# Patient Record
Sex: Female | Born: 1950 | Race: White | Hispanic: No | Marital: Married | Smoking: Former smoker
Health system: Southern US, Community
[De-identification: ages and names within clinical notes are randomized; demographics above are authoritative.]

## PROBLEM LIST (undated history)

## (undated) DIAGNOSIS — Z889 Allergy status to unspecified drugs, medicaments and biological substances status: Secondary | ICD-10-CM

## (undated) DIAGNOSIS — K219 Gastro-esophageal reflux disease without esophagitis: Secondary | ICD-10-CM

## (undated) DIAGNOSIS — F419 Anxiety disorder, unspecified: Secondary | ICD-10-CM

## (undated) DIAGNOSIS — F329 Major depressive disorder, single episode, unspecified: Secondary | ICD-10-CM

## (undated) DIAGNOSIS — E785 Hyperlipidemia, unspecified: Secondary | ICD-10-CM

## (undated) DIAGNOSIS — F32A Depression, unspecified: Secondary | ICD-10-CM

## (undated) DIAGNOSIS — I10 Essential (primary) hypertension: Secondary | ICD-10-CM

## (undated) DIAGNOSIS — H269 Unspecified cataract: Secondary | ICD-10-CM

## (undated) HISTORY — PX: TUBAL LIGATION: SHX77

## (undated) HISTORY — DX: Unspecified cataract: H26.9

## (undated) HISTORY — DX: Gastro-esophageal reflux disease without esophagitis: K21.9

---

## 2004-04-25 ENCOUNTER — Other Ambulatory Visit: Payer: Self-pay

## 2006-05-30 ENCOUNTER — Ambulatory Visit: Payer: Self-pay

## 2007-01-23 ENCOUNTER — Ambulatory Visit: Payer: Self-pay | Admitting: Family Medicine

## 2008-01-22 ENCOUNTER — Ambulatory Visit: Payer: Self-pay

## 2009-05-04 ENCOUNTER — Ambulatory Visit: Payer: Self-pay

## 2010-05-04 ENCOUNTER — Ambulatory Visit: Payer: Self-pay

## 2010-06-08 ENCOUNTER — Ambulatory Visit: Payer: Self-pay

## 2011-05-17 ENCOUNTER — Ambulatory Visit: Payer: Self-pay

## 2012-06-25 ENCOUNTER — Ambulatory Visit: Payer: Self-pay

## 2012-07-01 ENCOUNTER — Ambulatory Visit: Payer: Self-pay

## 2013-12-28 ENCOUNTER — Emergency Department: Payer: Self-pay | Admitting: Emergency Medicine

## 2014-06-03 LAB — TSH: TSH: 1.65 u[IU]/mL (ref 0.41–5.90)

## 2014-07-01 ENCOUNTER — Ambulatory Visit: Payer: Self-pay

## 2014-07-14 ENCOUNTER — Ambulatory Visit: Payer: Self-pay

## 2014-08-12 DIAGNOSIS — F329 Major depressive disorder, single episode, unspecified: Secondary | ICD-10-CM | POA: Diagnosis not present

## 2014-11-25 LAB — HEPATIC FUNCTION PANEL
ALT: 14 U/L (ref 7–35)
AST: 15 U/L (ref 13–35)
Alkaline Phosphatase: 107 U/L (ref 25–125)
BILIRUBIN, TOTAL: 0.3 mg/dL

## 2014-11-25 LAB — BASIC METABOLIC PANEL
BUN: 16 mg/dL (ref 4–21)
Creatinine: 0.7 mg/dL (ref 0.5–1.1)
GLUCOSE: 91 mg/dL
POTASSIUM: 4.6 mmol/L (ref 3.4–5.3)
SODIUM: 140 mmol/L (ref 137–147)

## 2014-11-25 LAB — HEMOGLOBIN A1C: Hemoglobin A1C: 6.2

## 2014-11-25 LAB — LIPID PANEL
Cholesterol: 197 mg/dL (ref 0–200)
HDL: 63 mg/dL (ref 35–70)
LDL CALC: 115 mg/dL
TRIGLYCERIDES: 94 mg/dL (ref 40–160)

## 2014-11-25 LAB — CBC AND DIFFERENTIAL
HEMATOCRIT: 42 % (ref 36–46)
Hemoglobin: 13.3 g/dL (ref 12.0–16.0)
NEUTROS ABS: 4 /uL
Platelets: 243 10*3/uL (ref 150–399)
WBC: 6.7 10^3/mL

## 2014-12-09 DIAGNOSIS — E785 Hyperlipidemia, unspecified: Secondary | ICD-10-CM | POA: Insufficient documentation

## 2014-12-09 DIAGNOSIS — I10 Essential (primary) hypertension: Secondary | ICD-10-CM | POA: Insufficient documentation

## 2014-12-18 ENCOUNTER — Other Ambulatory Visit: Payer: Self-pay | Admitting: Nurse Practitioner

## 2014-12-18 ENCOUNTER — Other Ambulatory Visit: Payer: Self-pay | Admitting: Oncology

## 2014-12-18 DIAGNOSIS — N6310 Unspecified lump in the right breast, unspecified quadrant: Secondary | ICD-10-CM

## 2014-12-18 DIAGNOSIS — R921 Mammographic calcification found on diagnostic imaging of breast: Secondary | ICD-10-CM

## 2015-01-13 ENCOUNTER — Ambulatory Visit
Admission: RE | Admit: 2015-01-13 | Discharge: 2015-01-13 | Disposition: A | Discharge status: Home / Self Care | Payer: Medicaid Other | Source: Ambulatory Visit | Attending: Oncology | Admitting: Oncology

## 2015-01-13 ENCOUNTER — Ambulatory Visit: Payer: Self-pay

## 2015-01-13 DIAGNOSIS — R921 Mammographic calcification found on diagnostic imaging of breast: Secondary | ICD-10-CM

## 2015-06-09 ENCOUNTER — Other Ambulatory Visit: Payer: Self-pay

## 2015-06-16 ENCOUNTER — Ambulatory Visit: Payer: Self-pay | Admitting: Internal Medicine

## 2015-06-22 ENCOUNTER — Other Ambulatory Visit: Payer: Self-pay | Admitting: Oncology

## 2015-06-22 DIAGNOSIS — R921 Mammographic calcification found on diagnostic imaging of breast: Secondary | ICD-10-CM

## 2015-06-23 ENCOUNTER — Other Ambulatory Visit: Payer: Self-pay | Admitting: Oncology

## 2015-06-23 ENCOUNTER — Other Ambulatory Visit: Payer: Self-pay | Admitting: Internal Medicine

## 2015-06-23 DIAGNOSIS — R921 Mammographic calcification found on diagnostic imaging of breast: Secondary | ICD-10-CM

## 2015-06-30 ENCOUNTER — Other Ambulatory Visit: Payer: Self-pay

## 2015-07-09 ENCOUNTER — Ambulatory Visit
Admission: RE | Admit: 2015-07-09 | Discharge: 2015-07-09 | Disposition: A | Discharge status: Home / Self Care | Payer: Medicare HMO | Source: Ambulatory Visit | Attending: Internal Medicine | Admitting: Internal Medicine

## 2015-07-09 DIAGNOSIS — R921 Mammographic calcification found on diagnostic imaging of breast: Secondary | ICD-10-CM | POA: Insufficient documentation

## 2015-07-12 ENCOUNTER — Other Ambulatory Visit: Payer: Self-pay | Admitting: Internal Medicine

## 2015-07-12 DIAGNOSIS — R921 Mammographic calcification found on diagnostic imaging of breast: Secondary | ICD-10-CM

## 2015-07-13 ENCOUNTER — Ambulatory Visit
Admission: RE | Admit: 2015-07-13 | Discharge: 2015-07-13 | Disposition: A | Discharge status: Home / Self Care | Payer: Medicare HMO | Source: Ambulatory Visit | Attending: Internal Medicine | Admitting: Internal Medicine

## 2015-07-13 DIAGNOSIS — R921 Mammographic calcification found on diagnostic imaging of breast: Secondary | ICD-10-CM

## 2015-07-13 HISTORY — PX: BREAST BIOPSY: SHX20

## 2015-07-14 LAB — SURGICAL PATHOLOGY

## 2015-09-09 NOTE — Addendum Note (Signed)
Encounter addended by: Wayne Both on: 09/09/2015  1:55 PM<BR>     Documentation filed: Charges VN

## 2015-09-17 ENCOUNTER — Encounter: Payer: Self-pay | Admitting: *Deleted

## 2015-09-20 ENCOUNTER — Encounter: Payer: Self-pay | Admitting: *Deleted

## 2015-09-20 ENCOUNTER — Encounter
Admission: RE | Disposition: A | Discharge status: Home / Self Care | Payer: Self-pay | Source: Ambulatory Visit | Attending: Gastroenterology

## 2015-09-20 ENCOUNTER — Encounter: Payer: Self-pay | Admitting: Certified Registered"

## 2015-09-20 ENCOUNTER — Ambulatory Visit
Admission: RE | Admit: 2015-09-20 | Discharge: 2015-09-20 | Disposition: A | Discharge status: Home / Self Care | Payer: Medicare HMO | Source: Ambulatory Visit | Attending: Gastroenterology | Admitting: Gastroenterology

## 2015-09-20 DIAGNOSIS — K5730 Diverticulosis of large intestine without perforation or abscess without bleeding: Secondary | ICD-10-CM | POA: Diagnosis not present

## 2015-09-20 DIAGNOSIS — K635 Polyp of colon: Secondary | ICD-10-CM | POA: Diagnosis not present

## 2015-09-20 DIAGNOSIS — J302 Other seasonal allergic rhinitis: Secondary | ICD-10-CM | POA: Insufficient documentation

## 2015-09-20 DIAGNOSIS — E785 Hyperlipidemia, unspecified: Secondary | ICD-10-CM | POA: Diagnosis not present

## 2015-09-20 DIAGNOSIS — I10 Essential (primary) hypertension: Secondary | ICD-10-CM | POA: Insufficient documentation

## 2015-09-20 DIAGNOSIS — F419 Anxiety disorder, unspecified: Secondary | ICD-10-CM | POA: Insufficient documentation

## 2015-09-20 DIAGNOSIS — D123 Benign neoplasm of transverse colon: Secondary | ICD-10-CM | POA: Insufficient documentation

## 2015-09-20 DIAGNOSIS — Z79899 Other long term (current) drug therapy: Secondary | ICD-10-CM | POA: Diagnosis not present

## 2015-09-20 DIAGNOSIS — Z1211 Encounter for screening for malignant neoplasm of colon: Secondary | ICD-10-CM | POA: Diagnosis present

## 2015-09-20 DIAGNOSIS — D122 Benign neoplasm of ascending colon: Secondary | ICD-10-CM | POA: Diagnosis not present

## 2015-09-20 DIAGNOSIS — K621 Rectal polyp: Secondary | ICD-10-CM | POA: Insufficient documentation

## 2015-09-20 HISTORY — PX: COLONOSCOPY WITH PROPOFOL: SHX5780

## 2015-09-20 HISTORY — DX: Allergy status to unspecified drugs, medicaments and biological substances: Z88.9

## 2015-09-20 HISTORY — DX: Anxiety disorder, unspecified: F41.9

## 2015-09-20 HISTORY — DX: Essential (primary) hypertension: I10

## 2015-09-20 HISTORY — DX: Hyperlipidemia, unspecified: E78.5

## 2015-09-20 SURGERY — COLONOSCOPY WITH PROPOFOL
Anesthesia: General

## 2015-09-20 MED ORDER — MIDAZOLAM HCL 5 MG/5ML IJ SOLN
INTRAMUSCULAR | Status: DC | PRN
  Administered 2015-09-20: 1 mg via INTRAVENOUS

## 2015-09-20 MED ORDER — MIDAZOLAM HCL 5 MG/5ML IJ SOLN
INTRAMUSCULAR | Status: AC
  Filled 2015-09-20: qty 5

## 2015-09-20 MED ORDER — FENTANYL CITRATE (PF) 100 MCG/2ML IJ SOLN
INTRAMUSCULAR | Status: DC | PRN
  Administered 2015-09-20: 25 ug via INTRAVENOUS

## 2015-09-20 MED ORDER — SODIUM CHLORIDE 0.9 % IV SOLN
INTRAVENOUS | Status: DC
  Administered 2015-09-20: 15:00:00 via INTRAVENOUS

## 2015-09-20 MED ORDER — PROMETHAZINE HCL 25 MG/ML IJ SOLN
INTRAMUSCULAR | Status: AC
  Filled 2015-09-20: qty 1

## 2015-09-20 MED ORDER — SODIUM CHLORIDE 0.9 % IV SOLN: INTRAVENOUS | Status: DC

## 2015-09-20 MED ORDER — FENTANYL CITRATE (PF) 100 MCG/2ML IJ SOLN
INTRAMUSCULAR | Status: DC | PRN
  Administered 2015-09-20: 50 ug via INTRAVENOUS

## 2015-09-20 MED ORDER — FENTANYL CITRATE (PF) 100 MCG/2ML IJ SOLN
INTRAMUSCULAR | Status: AC
  Filled 2015-09-20: qty 2

## 2015-09-20 MED ORDER — MIDAZOLAM HCL 5 MG/5ML IJ SOLN
INTRAMUSCULAR | Status: DC | PRN
  Administered 2015-09-20: 2 mg via INTRAVENOUS

## 2015-09-20 MED ORDER — PROMETHAZINE HCL 25 MG/ML IJ SOLN
INTRAMUSCULAR | Status: DC | PRN
  Administered 2015-09-20: 12.5 mg via INTRAVENOUS

## 2015-09-20 NOTE — Op Note (Signed)
[facility]enter[facility] Patient Name: Clarise Thielen Procedure Date: 09/20/2015 3:57 PM MRN: [id number] Account #: [id number] Date of Birth: [birth year] Admit Type: Outpatient Age: 65 Room: [facility] ENDO ROOM 3 Gender: Female Note Status: Finalized Procedure:         Colonoscopy Indications:       Screening for colorectal malignant neoplasm, This is the                     patient's first colonoscopy Providers:         Lollie Sails, MD Referring MD:      Perrin Maltese, MD (Referring MD) Medicines:         Fentanyl 125 micrograms IV, Midazolam 6 mg IV,                     Promethazine [id number] mg IV Complications:     No immediate complications. Procedure:         Pre-Anesthesia Assessment:                    - ASA Grade Assessment: II - A patient with mild systemic                     disease.                    After obtaining informed consent, the colonoscope was                     passed under direct vision. Throughout the procedure, the                     patient's blood pressure, pulse, and oxygen saturations                     were monitored continuously. The Colonoscope was                     introduced through the anus and advanced to the the cecum,                     identified by appendiceal orifice and ileocecal valve. The                     quality of the bowel preparation was good except the                     ascending colon was fair. Findings:      Multiple small-mouthed diverticula were found in the sigmoid colon and       in the descending colon.      A 2 mm polyp was found in the transverse colon. The polyp was sessile.       The polyp was removed with a cold biopsy forceps. Resection and       retrieval were complete.      A 3 mm polyp was found in the ascending colon. The polyp was sessile.       The polyp was removed with a cold biopsy forceps. Resection and       retrieval were complete.      A 5 mm polyp was found in the distal  transverse colon. The polyp was       flat. The polyp was removed with a cold biopsy forceps. The polyp was  removed with a cold snare. Resection and retrieval were complete.      A 2 mm polyp was found in the recto-sigmoid colon. The polyp was       sessile. The polyp was removed with a cold biopsy forceps. Resection and       retrieval were complete.      A 5 mm polyp was found in the rectum. The polyp was sessile. The polyp       was removed with a cold snare. Resection and retrieval were complete. Impression:        - Diverticulosis in the sigmoid colon and in the                     descending colon.                    - One 2 mm polyp in the transverse colon. Resected and                     retrieved.                    - One 3 mm polyp in the ascending colon. Resected and                     retrieved.                    - One 5 mm polyp in the distal transverse colon. Resected                     and retrieved.                    - One 2 mm polyp at the recto-sigmoid colon. Resected and                     retrieved.                    - One 5 mm polyp in the rectum. Resected and retrieved. Recommendation:    - Await pathology results.                    - Full liquid diet today.                    - Soft diet for 2 days. Procedure Code(s): --- Professional ---                    [phone], Colonoscopy, flexible; with removal of tumor(s),                     polyp(s), or other lesion(s) by snare technique                    [id number], 46, Colonoscopy, flexible; with biopsy, single or                     multiple Diagnosis Code(s): --- Professional ---                    Z12.11, Encounter for screening for malignant neoplasm of                     colon  D12.2, Benign neoplasm of ascending colon                    D12.3, Benign neoplasm of transverse colon                    D12.7, Benign neoplasm of rectosigmoid junction                    K62.1, Rectal  polyp                    K57.30, Diverticulosis of large intestine without                     perforation or abscess without bleeding CPT copyright 2014 American Medical Association. All rights reserved. The codes documented in this report are preliminary and upon coder review may  be revised to meet current compliance requirements. Lollie Sails, MD 09/20/2015 4:59:11 PM This report has been signed electronically. Number of Addenda: 0 Note Initiated On: 09/20/2015 3:57 PM Scope Withdrawal Time: 0 hours 20 minutes 6 seconds  Total Procedure Duration: 0 hours 36 minutes 46 seconds       [facility]

## 2015-09-20 NOTE — H&P (Signed)
Outpatient short stay form Pre-procedure 09/20/2015 3:54 PM Lollie Sails MD  Primary Physician: Dr Lamonte Sakai  Reason for visit:  Screening colonoscopy  History of present illness:  Patient is a 65 year old female presenting today for a colonoscopy. She's never had a colonoscopy in the past. She tolerated her prep well. She takes no anticoagulation medications or aspirin products.    Current facility-administered medications:  .  0.9 %  sodium chloride infusion, , Intravenous, Continuous, Lollie Sails, MD, Last Rate: 20 mL/hr at 09/20/15 1441 .  0.9 %  sodium chloride infusion, , Intravenous, Continuous, Lollie Sails, MD .  fentaNYL (SUBLIMAZE) 100 MCG/2ML injection, , , ,  .  midazolam (VERSED) 5 MG/5ML injection, , , ,  .  promethazine (PHENERGAN) 25 MG/ML injection, , , ,   Prescriptions prior to admission  Medication Sig Dispense Refill Last Dose  . FLUoxetine (PROZAC) 10 MG capsule Take 10 mg by mouth daily.   09/19/2015 at Unknown time  . lisinopril-hydrochlorothiazide (PRINZIDE,ZESTORETIC) 10-12.5 MG tablet Take 1 tablet by mouth daily.   09/19/2015 at Unknown time  . meloxicam (MOBIC) 15 MG tablet Take 15 mg by mouth daily.   Past Month at Unknown time  . pravastatin (PRAVACHOL) 20 MG tablet Take 20 mg by mouth daily.   09/19/2015 at Unknown time  . bacitracin-neomycin-polymyxin-hydrocortisone (CORTISPORIN) 1 % ointment Apply 1 application topically daily. Reported on 09/20/2015   Completed Course at Unknown time  . loratadine (CLARITIN) 10 MG tablet Take 10 mg by mouth daily. Reported on 09/20/2015   Not Taking at Unknown time  . neomycin-polymyxin-gramicidin (NEOSPORIN) 1.75-10000-.025 ophthalmic solution Place 2 drops into the left eye 4 (four) times daily. Reported on 09/20/2015   Completed Course at Unknown time     No Known Allergies   Past Medical History  Diagnosis Date  . Hyperlipidemia   . Hypertension   . Anxiety   . H/O seasonal allergies      Review of systems:      Physical Exam    Heart and lungs: Regular rate and rhythm without rub or gallop, lungs are bilaterally clear    HEENT: Normocephalic atraumatic eyes are anicteric    Other:     Pertinant exam for procedure: Soft nontender nondistended bowel sounds positive normoactive    Planned proceedures: Colonoscopy and indicated procedures. I have discussed the risks benefits and complications of procedures to include not limited to bleeding, infection, perforation and the risk of sedation and the patient wishes to proceed.    Lollie Sails, MD Gastroenterology 09/20/2015  3:54 PM

## 2015-09-22 ENCOUNTER — Encounter: Payer: Self-pay | Admitting: Gastroenterology

## 2015-09-22 LAB — SURGICAL PATHOLOGY

## 2016-04-12 DIAGNOSIS — I10 Essential (primary) hypertension: Secondary | ICD-10-CM

## 2016-04-12 DIAGNOSIS — E785 Hyperlipidemia, unspecified: Secondary | ICD-10-CM

## 2016-07-25 ENCOUNTER — Other Ambulatory Visit: Payer: Self-pay | Admitting: Internal Medicine

## 2016-07-25 DIAGNOSIS — Z1231 Encounter for screening mammogram for malignant neoplasm of breast: Secondary | ICD-10-CM

## 2016-07-31 ENCOUNTER — Ambulatory Visit
Admission: RE | Admit: 2016-07-31 | Discharge: 2016-07-31 | Disposition: A | Discharge status: Home / Self Care | Payer: Medicare Other | Source: Ambulatory Visit | Attending: Internal Medicine | Admitting: Internal Medicine

## 2016-07-31 DIAGNOSIS — Z1231 Encounter for screening mammogram for malignant neoplasm of breast: Secondary | ICD-10-CM

## 2017-07-20 ENCOUNTER — Other Ambulatory Visit: Payer: Self-pay | Admitting: Internal Medicine

## 2017-07-20 DIAGNOSIS — Z1231 Encounter for screening mammogram for malignant neoplasm of breast: Secondary | ICD-10-CM

## 2017-08-10 ENCOUNTER — Ambulatory Visit
Admission: RE | Admit: 2017-08-10 | Discharge: 2017-08-10 | Disposition: A | Discharge status: Home / Self Care | Payer: Medicare Other | Source: Ambulatory Visit | Attending: Internal Medicine | Admitting: Internal Medicine

## 2017-08-10 DIAGNOSIS — Z1231 Encounter for screening mammogram for malignant neoplasm of breast: Secondary | ICD-10-CM

## 2018-03-11 ENCOUNTER — Ambulatory Visit: Payer: Self-pay | Admitting: General Surgery

## 2018-03-22 ENCOUNTER — Other Ambulatory Visit: Payer: Self-pay

## 2018-03-22 ENCOUNTER — Encounter
Admission: RE | Admit: 2018-03-22 | Discharge: 2018-03-22 | Disposition: A | Discharge status: Home / Self Care | Payer: Medicare Other | Source: Ambulatory Visit | Attending: General Surgery | Admitting: General Surgery

## 2018-03-22 DIAGNOSIS — Z01810 Encounter for preprocedural cardiovascular examination: Secondary | ICD-10-CM | POA: Insufficient documentation

## 2018-03-22 DIAGNOSIS — Z01812 Encounter for preprocedural laboratory examination: Secondary | ICD-10-CM | POA: Diagnosis not present

## 2018-03-22 DIAGNOSIS — I10 Essential (primary) hypertension: Secondary | ICD-10-CM | POA: Diagnosis not present

## 2018-03-22 HISTORY — DX: Major depressive disorder, single episode, unspecified: F32.9

## 2018-03-22 HISTORY — DX: Depression, unspecified: F32.A

## 2018-03-22 NOTE — Patient Instructions (Signed)
Your procedure is scheduled on: March 29, 2018 FRIDAY Report to Day Surgery on the 2nd floor of the [facility]. To find out your arrival time, please call [phone] between 1PM - 3PM on: Thursday March 28, 2018   REMEMBER: Instructions that are not followed completely may result in serious medical risk, up to and including death; or upon the discretion of your surgeon and anesthesiologist your surgery may need to be rescheduled.  Do not eat food after midnight the night before your procedure.  No gum chewing, lozengers or hard candies.  You may however, drink CLEAR liquids up to 2 hours before you are scheduled to arrive for your surgery. Do not drink anything within 2 hours of the start of your surgery.  Clear liquids include: - water  - apple juice without pulp - clear gatorade - black coffee or tea (Do NOT add anything to the coffee or tea) Do NOT drink anything that is not on this list.  Type 1 and Type 2 diabetics should only drink water.  No Alcohol for 24 hours before or after surgery.  No Smoking including e-cigarettes for 24 hours prior to surgery.  No chewable tobacco products for at least 6 hours prior to surgery.  No nicotine patches on the day of surgery.  On the morning of surgery brush your teeth with toothpaste and water, you may rinse your mouth with mouthwash if you wish. Do not swallow any toothpaste or mouthwash.  Notify your doctor if there is any change in your medical condition (cold, fever, infection).  Do not wear jewelry, make-up, hairpins, clips or nail polish.  Do not wear lotions, powders, or perfumes. You may NOT wear deodorant.  Do not shave 48 hours prior to surgery. Men may shave face and neck.  Contacts and dentures may not be worn into surgery.  Do not bring valuables to the hospital, including drivers license, insurance or credit cards.  [place] is not responsible for any belongings or valuables.   TAKE THESE MEDICATIONS THE  MORNING OF SURGERY:  Use CHG Soap as directed on instruction sheet.   Stop Anti-inflammatories (NSAIDS) such as Advil, Aleve, Ibuprofen, Motrin, Naproxen, Naprosyn and Aspirin based products such as Excedrin, Goodys Powder, BC Powder. (May take Tylenol or Acetaminophen if needed.)  Stop ANY OVER THE COUNTER supplements until after surgery. (May continue Vitamin D, Vitamin B, and multivitamin.)  Wear comfortable clothing (specific to your surgery type) to the hospital.  Plan for stool softeners for home use.  If you are being admitted to the hospital overnight, leave your suitcase in the car. After surgery it may be brought to your room.  If you are being discharged the day of surgery, you will not be allowed to drive home. You will need a responsible adult to drive you home and stay with you that night.   If you are taking public transportation, you will need to have a responsible adult with you. Please confirm with your physician that it is acceptable to use public transportation.   Please call [phone] if you have any questions about these instructions.

## 2018-03-29 ENCOUNTER — Encounter
Admission: RE | Disposition: A | Discharge status: Home / Self Care | Payer: Self-pay | Source: Ambulatory Visit | Attending: General Surgery

## 2018-03-29 ENCOUNTER — Ambulatory Visit: Payer: Medicare Other | Admitting: Certified Registered Nurse Anesthetist

## 2018-03-29 ENCOUNTER — Other Ambulatory Visit: Payer: Self-pay

## 2018-03-29 ENCOUNTER — Ambulatory Visit
Admission: RE | Admit: 2018-03-29 | Discharge: 2018-03-29 | Disposition: A | Discharge status: Home / Self Care | Payer: Medicare Other | Source: Ambulatory Visit | Attending: General Surgery | Admitting: General Surgery

## 2018-03-29 DIAGNOSIS — I10 Essential (primary) hypertension: Secondary | ICD-10-CM | POA: Insufficient documentation

## 2018-03-29 DIAGNOSIS — Z79899 Other long term (current) drug therapy: Secondary | ICD-10-CM | POA: Insufficient documentation

## 2018-03-29 DIAGNOSIS — E785 Hyperlipidemia, unspecified: Secondary | ICD-10-CM | POA: Insufficient documentation

## 2018-03-29 DIAGNOSIS — Z87891 Personal history of nicotine dependence: Secondary | ICD-10-CM | POA: Insufficient documentation

## 2018-03-29 DIAGNOSIS — D1721 Benign lipomatous neoplasm of skin and subcutaneous tissue of right arm: Secondary | ICD-10-CM | POA: Diagnosis present

## 2018-03-29 DIAGNOSIS — E7800 Pure hypercholesterolemia, unspecified: Secondary | ICD-10-CM | POA: Diagnosis not present

## 2018-03-29 DIAGNOSIS — F329 Major depressive disorder, single episode, unspecified: Secondary | ICD-10-CM | POA: Diagnosis not present

## 2018-03-29 DIAGNOSIS — F419 Anxiety disorder, unspecified: Secondary | ICD-10-CM | POA: Insufficient documentation

## 2018-03-29 HISTORY — PX: LIPOMA EXCISION: SHX5283

## 2018-03-29 SURGERY — EXCISION LIPOMA
Anesthesia: General | Site: Arm Lower | Laterality: Right | Wound class: Clean

## 2018-03-29 MED ORDER — CHLORHEXIDINE GLUCONATE CLOTH 2 % EX PADS: 6.0000 | MEDICATED_PAD | Freq: Once | CUTANEOUS | Status: DC

## 2018-03-29 MED ORDER — MIDAZOLAM HCL 5 MG/5ML IJ SOLN
INTRAMUSCULAR | Status: AC
  Filled 2018-03-29: qty 5

## 2018-03-29 MED ORDER — LACTATED RINGERS IV SOLN
INTRAVENOUS | Status: DC
  Administered 2018-03-29: 11:00:00 via INTRAVENOUS

## 2018-03-29 MED ORDER — FAMOTIDINE 20 MG PO TABS
ORAL_TABLET | ORAL | Status: AC
  Administered 2018-03-29: 20 mg via ORAL
  Filled 2018-03-29: qty 1

## 2018-03-29 MED ORDER — PROPOFOL 500 MG/50ML IV EMUL
INTRAVENOUS | Status: AC
Start: 2018-03-29 — End: ?
  Filled 2018-03-29: qty 50

## 2018-03-29 MED ORDER — PROMETHAZINE HCL 25 MG/ML IJ SOLN: 6.2500 mg | INTRAMUSCULAR | Status: DC | PRN

## 2018-03-29 MED ORDER — BUPIVACAINE HCL (PF) 0.25 % IJ SOLN
INTRAMUSCULAR | Status: AC
Start: 2018-03-29 — End: ?
  Filled 2018-03-29: qty 30

## 2018-03-29 MED ORDER — FENTANYL CITRATE (PF) 100 MCG/2ML IJ SOLN
INTRAMUSCULAR | Status: AC
  Filled 2018-03-29: qty 2

## 2018-03-29 MED ORDER — PROPOFOL 10 MG/ML IV BOLUS
INTRAVENOUS | Status: AC
  Filled 2018-03-29: qty 20

## 2018-03-29 MED ORDER — OXYCODONE HCL 5 MG PO TABS: 5.0000 mg | ORAL_TABLET | Freq: Once | ORAL | Status: DC | PRN

## 2018-03-29 MED ORDER — OXYCODONE HCL 5 MG/5ML PO SOLN: 5.0000 mg | Freq: Once | ORAL | Status: DC | PRN

## 2018-03-29 MED ORDER — FENTANYL CITRATE (PF) 100 MCG/2ML IJ SOLN: 25.0000 ug | INTRAMUSCULAR | Status: DC | PRN

## 2018-03-29 MED ORDER — LIDOCAINE-EPINEPHRINE (PF) 1 %-1:200000 IJ SOLN
INTRAMUSCULAR | Status: DC | PRN
  Administered 2018-03-29: 6 mL

## 2018-03-29 MED ORDER — LIDOCAINE-EPINEPHRINE (PF) 1 %-1:200000 IJ SOLN
INTRAMUSCULAR | Status: AC
  Filled 2018-03-29: qty 30

## 2018-03-29 MED ORDER — FAMOTIDINE 20 MG PO TABS
20.0000 mg | ORAL_TABLET | Freq: Once | ORAL | Status: AC
  Administered 2018-03-29: 20 mg via ORAL

## 2018-03-29 MED ORDER — MIDAZOLAM HCL 2 MG/2ML IJ SOLN
INTRAMUSCULAR | Status: AC
  Filled 2018-03-29: qty 2

## 2018-03-29 MED ORDER — MEPERIDINE HCL 50 MG/ML IJ SOLN: 6.2500 mg | INTRAMUSCULAR | Status: DC | PRN

## 2018-03-29 MED ORDER — ONDANSETRON HCL 4 MG/2ML IJ SOLN
INTRAMUSCULAR | Status: AC
  Filled 2018-03-29: qty 2

## 2018-03-29 SURGICAL SUPPLY — 29 items
CANISTER SUCT 3000ML PPV (MISCELLANEOUS) IMPLANT
CHLORAPREP W/TINT 26ML (MISCELLANEOUS) ×3 IMPLANT
DERMABOND ADVANCED (GAUZE/BANDAGES/DRESSINGS) ×2
DERMABOND ADVANCED .7 DNX12 (GAUZE/BANDAGES/DRESSINGS) ×1 IMPLANT
DRAPE PED LAPAROTOMY (DRAPES) ×3 IMPLANT
DRAPE UTILITY XL STRL (DRAPES) ×6 IMPLANT
ELECT CAUTERY BLADE 6.4 (BLADE) ×3 IMPLANT
ELECT REM PT RETURN 9FT ADLT (ELECTROSURGICAL) ×3
ELECTRODE REM PT RTRN 9FT ADLT (ELECTROSURGICAL) ×1 IMPLANT
GAUZE SPONGE 4X4 12PLY STRL (GAUZE/BANDAGES/DRESSINGS) IMPLANT
GLOVE BIO SURGEON STRL SZ7.5 (GLOVE) ×3 IMPLANT
GOWN STRL REUS W/ TWL LRG LVL3 (GOWN DISPOSABLE) ×2 IMPLANT
GOWN STRL REUS W/TWL LRG LVL3 (GOWN DISPOSABLE) ×4
HANDLE YANKAUER SUCT BULB TIP (MISCELLANEOUS) IMPLANT
KIT BASIN OR (CUSTOM PROCEDURE TRAY) ×3 IMPLANT
KIT TURNOVER KIT A (KITS) ×3 IMPLANT
NEEDLE HYPO 25GX1X1/2 BEV (NEEDLE) ×3 IMPLANT
NS IRRIG 1000ML POUR BTL (IV SOLUTION) ×3 IMPLANT
PACK BASIN MINOR ARMC (MISCELLANEOUS) ×3 IMPLANT
PAD ARMBOARD 7.5X6 YLW CONV (MISCELLANEOUS) IMPLANT
PENCIL ELECTRO HAND CTR (MISCELLANEOUS) IMPLANT
SPONGE LAP 18X18 RF (DISPOSABLE) IMPLANT
SUT MNCRL AB 4-0 PS2 18 (SUTURE) ×3 IMPLANT
SUT VIC AB 3-0 SH 27 (SUTURE) ×2
SUT VIC AB 3-0 SH 27X BRD (SUTURE) ×1 IMPLANT
SYR BULB 3OZ (MISCELLANEOUS) IMPLANT
SYR CONTROL 10ML LL (SYRINGE) ×3 IMPLANT
TUBING CONNECTING 10 (TUBING) ×2 IMPLANT
TUBING CONNECTING 10' (TUBING) ×1

## 2018-03-29 NOTE — OR Nursing (Signed)
Discharge instructions discussed with pt and daughter. Both voice understanding. 

## 2018-03-29 NOTE — H&P (Signed)
Allison Harvey  Location: [place] Office Patient #: [phone] DOB: 03-Jul-1951 Widowed / Language: [name] / Race: White Female   History of Present Illness  The patient is a 67 year old female who presents with a complaint of Mass. we are asked to see the patient in consultation by Dr. Lamonte Sakai to evaluate her for a lipoma on the right forearm. The patient is a 67 year old white female who has had a small mass on her right forearm for a number of years. More recently it has caused her some discomfort. She denies any fevers or chills. She denies any drainage from the area. She denies any numbness or weakness in the hand.   Past Surgical History  Colon Polyp Removal - Colonoscopy   Diagnostic Studies History  Colonoscopy  1-5 years ago Mammogram  within last year Pap Smear  1-5 years ago  Allergies  No Known Drug Allergies   Medication History  Atorvastatin Calcium (40MG  Tablet, Oral) Active. BusPIRone HCl (10MG  Tablet, Oral) Active. HydroCHLOROthiazide (12.5MG  Tablet, Oral) Active. Lisinopril (5MG  Tablet, Oral) Active.  Social History  Alcohol use  Remotely quit alcohol use. Caffeine use  Coffee, Tea. No drug use  Tobacco use  Former smoker.  Family History  Alcohol Abuse  Father, Sister, Son. Depression  Brother, Sister, Son. Hypertension  Son.  Pregnancy / Birth History  Age at menarche  14 years. Age of menopause  7-50 Gravida  3 Irregular periods  Maternal age  69-20 Para  3  Other Problems  Anxiety Disorder  Depression  Gastroesophageal Reflux Disease  High blood pressure  Hypercholesterolemia     Review of Systems  General Not Present- Appetite Loss, Chills, Fatigue, Fever, Night Sweats, Weight Gain and Weight Loss. Skin Not Present- Change in Wart/Mole, Dryness, Hives, Jaundice, New Lesions, Non-Healing Wounds, Rash and Ulcer. HEENT Present- Hearing Loss, Seasonal Allergies and Wears glasses/contact lenses. Not  Present- Earache, Hoarseness, Nose Bleed, Oral Ulcers, Ringing in the Ears, Sinus Pain, Sore Throat, Visual Disturbances and Yellow Eyes. Respiratory Not Present- Bloody sputum, Chronic Cough, Difficulty Breathing, Snoring and Wheezing. Breast Not Present- Breast Mass, Breast Pain, Nipple Discharge and Skin Changes. Cardiovascular Not Present- Chest Pain, Difficulty Breathing Lying Down, Leg Cramps, Palpitations, Rapid Heart Rate, Shortness of Breath and Swelling of Extremities. Gastrointestinal Not Present- Abdominal Pain, Bloating, Bloody Stool, Change in Bowel Habits, Chronic diarrhea, Constipation, Difficulty Swallowing, Excessive gas, Gets full quickly at meals, Hemorrhoids, Indigestion, Nausea, Rectal Pain and Vomiting. Female Genitourinary Not Present- Frequency, Nocturia, Painful Urination, Pelvic Pain and Urgency. Musculoskeletal Not Present- Back Pain, Joint Pain, Joint Stiffness, Muscle Pain, Muscle Weakness and Swelling of Extremities. Neurological Present- Numbness. Not Present- Decreased Memory, Fainting, Headaches, Seizures, Tingling, Tremor, Trouble walking and Weakness. Psychiatric Present- Anxiety, Depression and Fearful. Not Present- Bipolar, Change in Sleep Pattern and Frequent crying. Endocrine Not Present- Cold Intolerance, Excessive Hunger, Hair Changes, Heat Intolerance, Hot flashes and New Diabetes. Hematology Not Present- Blood Thinners, Easy Bruising, Excessive bleeding, Gland problems, HIV and Persistent Infections.   Physical Exam  General Mental Status-Alert. General Appearance-Consistent with stated age. Hydration-Well hydrated. Voice-Normal.  Integumentary Note: there is a 3 cm fatty feeling mass in the superficial subcutaneous tissue of the right forearm along the ulnar aspect. The mass is mobile and does not seem to be involving the muscle. There are no overlying skin changes.   Head and Neck Head-normocephalic, atraumatic with no lesions or  palpable masses. Trachea-midline. Thyroid Gland Characteristics - normal size and consistency.  Eye Eyeball -  Bilateral-Extraocular movements intact. Sclera/Conjunctiva - Bilateral-No scleral icterus.  Chest and Lung Exam Chest and lung exam reveals -quiet, even and easy respiratory effort with no use of accessory muscles and on auscultation, normal breath sounds, no adventitious sounds and normal vocal resonance. Inspection Chest Wall - Normal. Back - normal.  Cardiovascular Cardiovascular examination reveals -normal heart sounds, regular rate and rhythm with no murmurs and normal pedal pulses bilaterally.  Abdomen Inspection Inspection of the abdomen reveals - No Hernias. Skin - Scar - no surgical scars. Palpation/Percussion Palpation and Percussion of the abdomen reveal - Soft, Non Tender, No Rebound tenderness, No Rigidity (guarding) and No hepatosplenomegaly. Auscultation Auscultation of the abdomen reveals - Bowel sounds normal.  Neurologic Neurologic evaluation reveals -alert and oriented x 3 with no impairment of recent or remote memory. Mental Status-Normal.  Musculoskeletal Normal Exam - Left-Upper Extremity Strength Normal and Lower Extremity Strength Normal. Normal Exam - Right-Upper Extremity Strength Normal and Lower Extremity Strength Normal.  Lymphatic Head & Neck  General Head & Neck Lymphatics: Bilateral - Description - Normal. Axillary  General Axillary Region: Bilateral - Description - Normal. Tenderness - Non Tender. Femoral & Inguinal  Generalized Femoral & Inguinal Lymphatics: Bilateral - Description - Normal. Tenderness - Non Tender.    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 Because this causes her some discomfort she would like to have it removed and I think this is a  reasonable thing to do. I have discussed with her in detail the risks and benefits of the operation as well as some of the technical aspects and she understands and wishes to proceed. I think this could be done under local anesthesia in a minor room at [facility].

## 2018-03-29 NOTE — Discharge Instructions (Signed)

## 2018-03-29 NOTE — Op Note (Signed)
03/29/2018  2:46 PM  PATIENT:  Allison Harvey  67 y.o. female  PRE-OPERATIVE DIAGNOSIS:  lipoma right forearm  POST-OPERATIVE DIAGNOSIS:  lipoma right forearm  PROCEDURE:  Procedure(s): EXCISION LIPOMA- RIGHT FOREARM (Right)  SURGEON:  Surgeon(s) and Role:    Jovita Kussmaul, MD - Primary  PHYSICIAN ASSISTANT:   ASSISTANTS: none   ANESTHESIA:   local  EBL:  Minimal   BLOOD ADMINISTERED:none  DRAINS: none   LOCAL MEDICATIONS USED:  LIDOCAINE   SPECIMEN:  No Specimen  DISPOSITION OF SPECIMEN:  N/A  COUNTS:  YES  TOURNIQUET:  * No tourniquets in log *  DICTATION: .Dragon Dictation   After informed consent was obtained the patient was brought to the operating room and placed in the supine position on the operating table.  The right forearm was prepped with ChloraPrep, allowed to dry, and draped in usual sterile manner.  An appropriate timeout was performed.  The area around the palpable lipoma was infiltrated with 1% lidocaine with epinephrine until good field block was created.  A small longitudinally oriented incision was made with a 15 blade knife overlying the lipoma.  The lipoma was then separated from the rest of the subcutaneous fatty tissue by blunt hemostat dissection until the lipoma was completely removed.  She did not want the sent to pathology.  It did appear just like atypical lipoma would.  It measured 2 cm in diameter.  The area was examined and found to be hemostatic.  The incision was then closed with a running 4-0 Monocryl subcuticular stitch.  Dermabond dressings were applied.  The patient tolerated the procedure well.  At the end of the case all needle sponge and instrument counts were correct.  The patient was then awakened and taken to recovery in stable condition.  PLAN OF CARE: Discharge to home after PACU  PATIENT DISPOSITION:  PACU - hemodynamically stable.   Delay start of Pharmacological VTE agent (>24hrs) due to surgical blood loss or risk of  bleeding: not applicable

## 2018-03-29 NOTE — Anesthesia Preprocedure Evaluation (Signed)
Anesthesia Evaluation  Patient identified by MRN, date of birth, ID band Patient awake    Reviewed: Allergy & Precautions, NPO status , Patient's Chart, lab work & pertinent test results  History of Anesthesia Complications Negative for: history of anesthetic complications  Airway Mallampati: II  TM Distance: >3 FB Neck ROM: Full    Dental  (+) Upper Dentures, Partial Lower   Pulmonary neg sleep apnea, neg COPD, former smoker,    breath sounds clear to auscultation- rhonchi (-) wheezing      Cardiovascular hypertension, Pt. on medications (-) CAD, (-) Past MI, (-) Cardiac Stents and (-) CABG  Rhythm:Regular Rate:Normal - Systolic murmurs and - Diastolic murmurs    Neuro/Psych PSYCHIATRIC DISORDERS Anxiety Depression negative neurological ROS     GI/Hepatic Neg liver ROS, GERD  ,  Endo/Other  negative endocrine ROSneg diabetes  Renal/GU negative Renal ROS     Musculoskeletal negative musculoskeletal ROS (+)   Abdominal (+) - obese,   Peds  Hematology negative hematology ROS (+)   Anesthesia Other Findings Past Medical History: No date: Anxiety No date: Cataract No date: Depression No date: GERD (gastroesophageal reflux disease) No date: H/O seasonal allergies No date: Hyperlipidemia No date: Hypertension   Reproductive/Obstetrics                             Anesthesia Physical Anesthesia Plan  ASA: II  Anesthesia Plan: General   Post-op Pain Management:    Induction: Intravenous  PONV Risk Score and Plan: 2 and Propofol infusion  Airway Management Planned: Natural Airway  Additional Equipment:   Intra-op Plan:   Post-operative Plan:   Informed Consent: I have reviewed the patients History and Physical, chart, labs and discussed the procedure including the risks, benefits and alternatives for the proposed anesthesia with the patient or authorized representative who has  indicated his/her understanding and acceptance.   Dental advisory given  Plan Discussed with: CRNA and Anesthesiologist  Anesthesia Plan Comments:         Anesthesia Quick Evaluation

## 2018-03-29 NOTE — OR Nursing (Signed)
Dr Marlou Starks asked patient if she wanted her lipoma sent for pathology and patient stated that she didn't. Dr Marlou Starks stated not to send specimen

## 2018-03-29 NOTE — Interval H&P Note (Signed)
History and Physical Interval Note:  03/29/2018 1:33 PM  Allison Harvey  has presented today for surgery, with the diagnosis of lipoma right forearm  The various methods of treatment have been discussed with the patient and family. After consideration of risks, benefits and other options for treatment, the patient has consented to  Procedure(s): EXCISION LIPOMA- RIGHT FOREARM (Right) as a surgical intervention .  The patient's history has been reviewed, patient examined, no change in status, stable for surgery.  I have reviewed the patient's chart and labs.  Questions were answered to the patient's satisfaction.     TOTH III,PAUL S

## 2018-03-29 NOTE — OR Nursing (Signed)
Local anesthesia for this case 2 RN's present,2nd RN monitoring vital signs

## 2018-03-31 ENCOUNTER — Encounter: Payer: Self-pay | Admitting: General Surgery

## 2018-07-09 ENCOUNTER — Other Ambulatory Visit: Payer: Self-pay | Admitting: Internal Medicine

## 2018-07-09 DIAGNOSIS — Z1231 Encounter for screening mammogram for malignant neoplasm of breast: Secondary | ICD-10-CM

## 2018-08-12 ENCOUNTER — Ambulatory Visit
Admission: RE | Admit: 2018-08-12 | Discharge: 2018-08-12 | Disposition: A | Discharge status: Home / Self Care | Payer: Medicare Other | Source: Ambulatory Visit | Attending: Internal Medicine | Admitting: Internal Medicine

## 2018-08-12 DIAGNOSIS — Z1231 Encounter for screening mammogram for malignant neoplasm of breast: Secondary | ICD-10-CM | POA: Insufficient documentation

## 2018-11-28 ENCOUNTER — Ambulatory Visit: Admit: 2018-11-28 | Payer: Medicare Other | Admitting: Gastroenterology

## 2018-11-28 SURGERY — COLONOSCOPY WITH PROPOFOL
Anesthesia: General

## 2019-05-23 ENCOUNTER — Ambulatory Visit: Admit: 2019-05-23 | Payer: Medicare Other | Admitting: Gastroenterology

## 2019-05-23 SURGERY — COLONOSCOPY WITH PROPOFOL
Anesthesia: General

## 2019-06-16 ENCOUNTER — Other Ambulatory Visit: Payer: Self-pay | Admitting: Internal Medicine

## 2019-06-16 DIAGNOSIS — Z1231 Encounter for screening mammogram for malignant neoplasm of breast: Secondary | ICD-10-CM

## 2019-08-14 ENCOUNTER — Ambulatory Visit
Admission: RE | Admit: 2019-08-14 | Discharge: 2019-08-14 | Disposition: A | Discharge status: Home / Self Care | Payer: Medicare Other | Source: Ambulatory Visit | Attending: Internal Medicine | Admitting: Internal Medicine

## 2019-08-14 DIAGNOSIS — Z1231 Encounter for screening mammogram for malignant neoplasm of breast: Secondary | ICD-10-CM | POA: Insufficient documentation

## 2019-10-15 ENCOUNTER — Ambulatory Visit: Admission: RE | Admit: 2019-10-15 | Payer: Medicare Other | Source: Home / Self Care | Admitting: Internal Medicine

## 2019-10-15 ENCOUNTER — Encounter: Admission: RE | Payer: Self-pay | Source: Home / Self Care

## 2019-10-15 SURGERY — COLONOSCOPY WITH PROPOFOL
Anesthesia: General

## 2020-01-02 ENCOUNTER — Encounter: Payer: Self-pay | Admitting: Obstetrics

## 2020-01-02 ENCOUNTER — Ambulatory Visit (INDEPENDENT_AMBULATORY_CARE_PROVIDER_SITE_OTHER): Payer: Medicare Other | Admitting: Obstetrics

## 2020-01-02 ENCOUNTER — Other Ambulatory Visit: Payer: Self-pay

## 2020-01-02 VITALS — BP 110/80 | Ht 59.0 in | Wt 119.0 lb

## 2020-01-02 DIAGNOSIS — N941 Unspecified dyspareunia: Secondary | ICD-10-CM | POA: Diagnosis not present

## 2020-01-02 DIAGNOSIS — Z01419 Encounter for gynecological examination (general) (routine) without abnormal findings: Secondary | ICD-10-CM

## 2020-01-02 DIAGNOSIS — N898 Other specified noninflammatory disorders of vagina: Secondary | ICD-10-CM

## 2020-01-02 NOTE — Progress Notes (Signed)
Gynecology Annual Exam  PCP: Perrin Maltese, MD  Chief Complaint:  Chief Complaint  Patient presents with  . Gynecologic Exam   History of Present Illness: Patient is a 64 year new patient presents for annual gyn exam.  She is newly married x 2 months. Her Primary care is handled by a PCP who manages her hypertension, hyperlipidemia. She presents with questions regarding vaginal dryness.   She has not had intercourse since getting married, and has been prescribed Estradiol cream by her PCP. She requests an evaluation/education about the dryness, and wants to be sure there is no structural vagina problems.  Menopausal bleeding: denies. She is post menopausal since age 23.   Menopausal symptoms: denies  Breast symptoms: denies Recent mammogram in November 2020  Last pap smear: approximately 3-5} years ago.  Result Normal No history of abnormal paps  Last mammogram: 5 months ago.  Result Normal  Review of Systems: ROS performed. Focus on reproductive health. Denies any Neurological, Cardiac  HX. Resprtory- seasonal allergies treated with OTC meds. No hx asthma GI:  occasional consipation Skin:  ocassional dryness- uses body lotion Psych: : Anxiety Gyn: previous hx of hot flashes, some hot/cold intolerance. Vaginal dryness   Past Medical History:  Treated by her PCP for HTN, Hyperlipidemia, Anxiety  Past Surgical History:  Past Surgical History:  Procedure Laterality Date  . BREAST BIOPSY Right 07/13/2015   benign  . COLONOSCOPY WITH PROPOFOL N/A 09/20/2015   Procedure: COLONOSCOPY WITH PROPOFOL;  Surgeon: Lollie Sails, MD;  Location: [facility] ENDOSCOPY;  Service: Endoscopy;  Laterality: N/A;  . LIPOMA EXCISION Right 03/29/2018   Procedure: EXCISION LIPOMA- RIGHT FOREARM;  Surgeon: Jovita Kussmaul, MD;  Location: [facility] ORS;  Service: General;  Laterality: Right;  . TUBAL LIGATION      Gynecologic History:   Contraception: tubal ligation  Family History:  Reviewed  with patient- denies family hx of Cardiac, CA, Psych  Social History:  Recently remarried after 20 years single. Three children all adults. Non smoker, no ETOH use. Retired. Lives with her husband.   Allergies:  See allergy listing  Medications: Prior to Admission medications   Medication Sig Start Date End Date Taking? Authorizing Provider  acetaminophen (TYLENOL) 500 MG tablet Take 1,000 mg by mouth daily as needed for moderate pain or headache.   Yes [provider]  alendronate (FOSAMAX) 70 MG tablet Take by mouth.   Yes [provider]  atorvastatin (LIPITOR) 20 MG tablet Take 20 mg by mouth daily. 08/12/19  Yes [provider]  busPIRone (BUSPAR) 10 MG tablet Take 5 mg by mouth 2 (two) times daily.   Yes [provider]  cholecalciferol (VITAMIN D) 1000 units tablet Take 1,000 Units by mouth daily.   Yes [provider]  estradiol (ESTRACE) 0.1 MG/GM vaginal cream Place 1 g vaginally 2 (two) times a week. 12/23/19  Yes [provider]  fluticasone (FLONASE) 50 MCG/ACT nasal spray Place 1 spray into both nostrils daily as needed for allergies or rhinitis.   Yes [provider]  hydrochlorothiazide (HYDRODIURIL) 12.5 MG tablet Take 12.5 mg by mouth daily. 12/16/19  Yes [provider]  Ketotifen Fumarate (ALLERGY EYE DROPS OP) Place 1-2 drops into both eyes daily as needed (allergies).   Yes [provider]  lisinopril (PRINIVIL,ZESTRIL) 5 MG tablet Take 5 mg by mouth daily.   Yes [provider]  vitamin B-12 (CYANOCOBALAMIN) 500 MCG tablet Take 500 mcg by mouth  daily.   Yes [provider]  meloxicam (MOBIC) 15 MG tablet Take 15 mg by mouth daily as needed for pain.     [provider]      Physical Exam Vitals: Blood pressure 110/80, height 4\' 11"  (1.499 m), weight 119 lb (54 kg). Alert and oriented x 3 LungsL CTA bilaterally Heart: RRR Breasts: approx B cup, no masses or  nodules noted; no nipple discharge GI: +BS all 4 quads, non tender to palpation GYN: no pap today, normal hair distribution, normal labia, no rashes or lesions noted.   Atrophic changes noted (postmenopausal)  Gentle vaginal exam: no discharge noted, skin is pink, atrophy noted. Slight discomfort with vaginal exam. No CMT, uterus sl retroverted. No adnexal masses noted.  @MAMMOFINDINGS @ @MAMMONEXTRECDUEDATE @     Assessment: 69 y.o. G3P3003 No problem-specific Assessment & Plan notes found for this encounter.   Plan: Problem List Items Addressed This Visit    None    Visit Diagnoses    Women's annual routine gynecological examination    -  Primary   Vaginal dryness       Dyspareunia in female        vaginal dryness, vaginal atrophy; new patient for GYN visit  1) Mammogram  - recommend yearly screening mammogram  2) STI screening was declined 3) Pap smear- not done- discussed ACS recs regarding paps for women over 65 as optional or not needed. - 4) Osteoporosis  - per USPTF routine screening DEXA at age 66. This was done by her PCP   5) Routine healthcare maintenance including cholesterol, diabetes screening . Followed by her PCP  6) Colonoscopy- per her PCP   7) Follow up 1 year for routine annual 8)  Careful instructions regarding the use of her prescribed Estrogen cream. Discussed partner using ample lubrication, "gentle interaction"       She will follow up PRN aftr using her medication. List of OTC lubricants provided.   1. Women's annual routine gynecological examination   2. Vaginal dryness Has RX for generic estradiol cream.  3. Dyspareunia in female Secondary to normal atrophic changes.     Imagene Riches MSN, [facility]

## 2020-01-02 NOTE — Patient Instructions (Signed)
Atrophic Vaginitis  Atrophic vaginitis is a condition in which the tissues that line the vagina become dry and thin. This condition is most common in women who have stopped having regular menstrual periods (are in menopause). This usually starts when a woman is 45-71 years old. That is the time when a woman's estrogen levels begin to drop (decrease). Estrogen is a female hormone. It helps to keep the tissues of the vagina moist. It stimulates the vagina to produce a clear fluid that lubricates the vagina for sexual intercourse. This fluid also protects the vagina from infection. Lack of estrogen can cause the lining of the vagina to get thinner and dryer. The vagina may also shrink in size. It may become less elastic. Atrophic vaginitis tends to get worse over time as a woman's estrogen level drops. What are the causes? This condition is caused by the normal drop in estrogen that happens around the time of menopause. What increases the risk? Certain conditions or situations may lower a woman's estrogen level, leading to a higher risk for atrophic vaginitis. You are more likely to develop this condition if:  You are taking medicines that block estrogen.  You have had your ovaries removed.  You are being treated for cancer with X-ray (radiation) or medicines (chemotherapy).  You have given birth or are breastfeeding.  You are older than age 50.  You smoke. What are the signs or symptoms? Symptoms of this condition include:  Pain, soreness, or bleeding during sexual intercourse (dyspareunia).  Vaginal burning, irritation, or itching.  Pain or bleeding when a speculum is used in a vaginal exam (pelvic exam).  Having burning pain when passing urine.  Vaginal discharge that is brown or yellow. In some cases, there are no symptoms. How is this diagnosed? This condition is diagnosed by taking a medical history and doing a physical exam. This will include a pelvic exam that checks the  vaginal tissues. Though rare, you may also have other tests, including:  A urine test.  A test that checks the acid balance in your vagina (acid balance test). How is this treated? Treatment for this condition depends on how severe your symptoms are. Treatment may include:  Using an over-the-counter vaginal lubricant before sex.  Using a long-acting vaginal moisturizer.  Using low-dose vaginal estrogen for moderate to severe symptoms that do not respond to other treatments. Options include creams, tablets, and inserts (vaginal rings). Before you use a vaginal estrogen, tell your health care provider if you have a history of: ? Breast cancer. ? Endometrial cancer. ? Blood clots. If you are not sexually active and your symptoms are very mild, you may not need treatment. Follow these instructions at home: Medicines  Take over-the-counter and prescription medicines only as told by your health care provider. Do not use herbal or alternative medicines unless your health care provider says that you can.  Use over-the-counter creams, lubricants, or moisturizers for dryness only as directed by your health care provider. General instructions  If your atrophic vaginitis is caused by menopause, discuss all of your menopause symptoms and treatment options with your health care provider.  Do not douche.  Do not use products that can make your vagina dry. These include: ? Scented feminine sprays. ? Scented tampons. ? Scented soaps.  Vaginal intercourse can help to improve blood flow and elasticity of vaginal tissue. If it hurts to have sex, try using a lubricant or moisturizer just before having intercourse. Contact a health care provider if:    Your discharge looks different than normal.  Your vagina has an unusual smell.  You have new symptoms.  Your symptoms do not improve with treatment.  Your symptoms get worse. Summary  Atrophic vaginitis is a condition in which the tissues that  line the vagina become dry and thin. It is most common in women who have stopped having regular menstrual periods (are in menopause).  Treatment options include using vaginal lubricants and low-dose vaginal estrogen.  Contact a health care provider if your vagina has an unusual smell, or if your symptoms get worse or do not improve after treatment. This information is not intended to replace advice given to you by your health care provider. Make sure you discuss any questions you have with your health care provider. Document Revised: 08/03/2017 Document Reviewed: 05/17/2017 Elsevier Patient Education  2020 [employer]vier Inc.  

## 2020-02-24 ENCOUNTER — Telehealth: Payer: Self-pay

## 2020-02-24 NOTE — Telephone Encounter (Signed)
Pt calling; was seen 4/30; [facility] rec a specialist ; would like [facility] to call her with specialist's name, etc.  [phone]

## 2020-02-27 NOTE — Telephone Encounter (Signed)
Velva Harman, I reviewed my notes from her last visit, and cannot figure out what kind of problem she wanted addressed. It may be for urogyn or for HRT. Can you call her and inquire which particular problem she needed help with? Many thanks

## 2020-02-27 NOTE — Telephone Encounter (Signed)
Pt states she is recently married and things just don't feel right down there.  She is on estrogen but doesn't think it's helping much.

## 2020-02-27 NOTE — Telephone Encounter (Signed)
Allison Harvey- I think that a visit with one of the GYN physicians would make sense. I already prescribed her Estrogen cream. Perhaps a visit with Dr. Gilman Schmidt?  See if she is willing to talk to on of the docs-

## 2020-03-01 NOTE — Telephone Encounter (Signed)
Patient is schedule 03/16/20 with [facility]

## 2020-03-16 ENCOUNTER — Encounter: Payer: Self-pay | Admitting: Obstetrics and Gynecology

## 2020-03-16 ENCOUNTER — Ambulatory Visit: Payer: Medicare Other | Admitting: Obstetrics and Gynecology

## 2020-03-16 ENCOUNTER — Other Ambulatory Visit: Payer: Self-pay

## 2020-03-16 VITALS — BP 128/70 | HR 78 | Resp 18 | Ht 59.0 in | Wt 119.2 lb

## 2020-03-16 DIAGNOSIS — N760 Acute vaginitis: Secondary | ICD-10-CM

## 2020-03-16 NOTE — Patient Instructions (Signed)
[email or web address]

## 2020-03-16 NOTE — Progress Notes (Signed)
Patient ID: Allison Harvey, female   DOB: [birth year], 69 y.o.   MRN: [id number]  Reason for Consult: Gynecologic Exam (consult)   Referred by Perrin Maltese, MD  Subjective:     HPI:  Allison Harvey is a 69 y.o. female.  She is here with complaints of vaginal dryness and issues with intercourse.  She reports that she is newly married for the last 3 months.  She says that her and her husband share a lot of lumps but are having difficulty with intercourse.  She reports that her partner is very loving and does things to arouse her and make her feel good.  She however reports that they have not had penetrative intercourse.  She reports that her partner does not maintain erection but is able to ejaculate.  She reports that it has been over 20 years since she had penetrative intercourse.  She reports a few years of symptoms of vaginal dryness.  She has been using some over-the-counter lubricants such as K-Y jelly for lubrication during intercourse.  She reports that if she masturbates she is able to have a climax, however her partner is not able to help her achieve a climax.  This is distressing to her and her relationship.  She does not feel like she is enjoying intercourse as much.  She has been using vaginal estrogen without improvement in her symptoms.  She would like to see a Christian-based sex therapist for these concerns.  She would like to make sure that everything is normal with her vulva and vagina.   Past Medical History:  Diagnosis Date  . Anxiety   . Cataract   . Depression   . GERD (gastroesophageal reflux disease)   . H/O seasonal allergies   . Hyperlipidemia   . Hypertension    Family History  Problem Relation Age of Onset  . Stroke Mother        or MI  . Heart disease Mother   . Alcohol abuse Father   . Alcohol abuse Sister   . Hypertension Son   . Breast cancer Neg Hx    Past Surgical History:  Procedure Laterality Date  . BREAST BIOPSY Right 07/13/2015   benign  .  COLONOSCOPY WITH PROPOFOL N/A 09/20/2015   Procedure: COLONOSCOPY WITH PROPOFOL;  Surgeon: Lollie Sails, MD;  Location: [facility] ENDOSCOPY;  Service: Endoscopy;  Laterality: N/A;  . LIPOMA EXCISION Right 03/29/2018   Procedure: EXCISION LIPOMA- RIGHT FOREARM;  Surgeon: Jovita Kussmaul, MD;  Location: [facility] ORS;  Service: General;  Laterality: Right;  . TUBAL LIGATION      Short Social History:  Social History   Tobacco Use  . Smoking status: Former Smoker    Quit date: 09/19/1994    Years since quitting: 25.5  . Smokeless tobacco: Never Used  Substance Use Topics  . Alcohol use: No    No Known Allergies  Current Outpatient Medications  Medication Sig Dispense Refill  . acetaminophen (TYLENOL) 500 MG tablet Take 1,000 mg by mouth daily as needed for moderate pain or headache.    . alendronate (FOSAMAX) 70 MG tablet Take by mouth.    Marland Kitchen atorvastatin (LIPITOR) 20 MG tablet Take 20 mg by mouth daily.    . busPIRone (BUSPAR) 10 MG tablet Take 5 mg by mouth 2 (two) times daily.    . cholecalciferol (VITAMIN D) 1000 units tablet Take 1,000 Units by mouth daily.    Marland Kitchen estradiol (ESTRACE) 0.1 MG/GM vaginal cream Place  1 g vaginally 2 (two) times a week.    . fluticasone (FLONASE) 50 MCG/ACT nasal spray Place 1 spray into both nostrils daily as needed for allergies or rhinitis.    . hydrochlorothiazide (HYDRODIURIL) 12.5 MG tablet Take 12.5 mg by mouth daily.    Marland Kitchen Ketotifen Fumarate (ALLERGY EYE DROPS OP) Place 1-2 drops into both eyes daily as needed (allergies).    Marland Kitchen lisinopril (PRINIVIL,ZESTRIL) 5 MG tablet Take 5 mg by mouth daily.    . meloxicam (MOBIC) 15 MG tablet Take 15 mg by mouth daily as needed for pain.     . vitamin B-12 (CYANOCOBALAMIN) 500 MCG tablet Take 500 mcg by mouth daily.     No current facility-administered medications for this visit.    Review of Systems  Constitutional: Negative for chills, fatigue, fever and unexpected weight change.  HENT: Negative for trouble  swallowing.  Eyes: Negative for loss of vision.  Respiratory: Negative for cough, shortness of breath and wheezing.  Cardiovascular: Negative for chest pain, leg swelling, palpitations and syncope.  GI: Negative for abdominal pain, blood in stool, diarrhea, nausea and vomiting.  GU: Negative for difficulty urinating, dysuria, frequency and hematuria.  Musculoskeletal: Negative for back pain, leg pain and joint pain.  Skin: Negative for rash.  Neurological: Negative for dizziness, headaches, light-headedness, numbness and seizures.  Psychiatric: Negative for behavioral problem, confusion, depressed mood and sleep disturbance.        Objective:  Objective   Vitals:   03/16/20 0930  BP: 128/70  Pulse: 78  Resp: 18  SpO2: 97%  Weight: 119 lb 3.2 oz (54.1 kg)  Height: 4\' 11"  (1.499 m)   Body mass index is 24.08 kg/m.  Physical Exam Vitals and nursing note reviewed.  Constitutional:      Appearance: She is well-developed.  HENT:     Head: Normocephalic and atraumatic.  Eyes:     Pupils: Pupils are equal, round, and reactive to light.  Cardiovascular:     Rate and Rhythm: Normal rate and regular rhythm.  Pulmonary:     Effort: Pulmonary effort is normal. No respiratory distress.  Genitourinary:    Comments: External: Normal appearing vulva. No lesions noted. Minimal atrophy. Speculum examination: Normal appearing cervix. No blood in the vaginal vault. Thick white discharge.   Bimanual examination: Uterus midline, non-tender, normal in size, shape and contour.  No CMT. No adnexal masses. No adnexal tenderness. Pelvis not fixed.    Skin:    General: Skin is warm and dry.  Neurological:     Mental Status: She is alert and oriented to person, place, and time.  Psychiatric:        Behavior: Behavior normal.        Thought Content: Thought content normal.        Judgment: Judgment normal.        Assessment/Plan:     69 yo with Mild vaginal atrophy and difficulties with  intercoure.  Will send nuswab to rule out vaginitis given presence of clumpy white discharge.  Vaginal dryness and atrophy- Discussed continued use of vaginal estrogen 1-2 times a week applied to the exterior vulva or vaginally with applicator.  Discussed available OTC vaginal moisturizers such as Replens.  Referred to [email or web address] as a resource to find [facility] sex therapist and counselors.  If no improvement with vaginal estrogen can consider intrarosa, ospemifene, or testosterone.  Will follow up in 4 weeks.   More than 30 minutes were spent face to face with the  patient in the room, reviewing the medical record, labs and images, and coordinating care for the patient. The plan of management was discussed in detail and counseling was provided.     Adrian Prows MD, [facility], [facility] 03/16/2020 10:36 AM

## 2020-03-18 LAB — NUSWAB BV AND CANDIDA, NAA
Candida albicans, NAA: NEGATIVE
Candida glabrata, NAA: NEGATIVE

## 2020-03-18 NOTE — Progress Notes (Signed)
Please call patient and let her know she did not have infection. Thank you! - Dr. Gilman Schmidt

## 2020-03-22 ENCOUNTER — Telehealth: Payer: Self-pay

## 2020-03-22 NOTE — Telephone Encounter (Signed)
Pt has been notified she dose not have any infection on the test that was prefomed here in the office.

## 2020-06-24 ENCOUNTER — Other Ambulatory Visit: Payer: Self-pay | Admitting: Internal Medicine

## 2020-06-24 DIAGNOSIS — Z1231 Encounter for screening mammogram for malignant neoplasm of breast: Secondary | ICD-10-CM

## 2020-08-16 ENCOUNTER — Ambulatory Visit
Admission: RE | Admit: 2020-08-16 | Discharge: 2020-08-16 | Disposition: A | Discharge status: Home / Self Care | Payer: Medicare Other | Source: Ambulatory Visit | Attending: Internal Medicine | Admitting: Internal Medicine

## 2020-08-16 ENCOUNTER — Other Ambulatory Visit: Payer: Self-pay

## 2020-08-16 DIAGNOSIS — Z1231 Encounter for screening mammogram for malignant neoplasm of breast: Secondary | ICD-10-CM | POA: Insufficient documentation

## 2021-01-04 ENCOUNTER — Ambulatory Visit (INDEPENDENT_AMBULATORY_CARE_PROVIDER_SITE_OTHER): Payer: Medicare Other | Admitting: Obstetrics

## 2021-01-04 ENCOUNTER — Encounter: Payer: Self-pay | Admitting: Obstetrics

## 2021-01-04 ENCOUNTER — Other Ambulatory Visit: Payer: Self-pay

## 2021-01-04 VITALS — BP 110/70 | Ht 59.0 in | Wt 122.0 lb

## 2021-01-04 DIAGNOSIS — Z1231 Encounter for screening mammogram for malignant neoplasm of breast: Secondary | ICD-10-CM

## 2021-01-04 NOTE — Progress Notes (Signed)
Gynecology Annual Exam  PCP: Perrin Maltese, MD  Chief Complaint:  Chief Complaint  Patient presents with  . Gynecologic Exam    No concerns    History of Present Illness:Patient is a 70 y.o. G3P3003 presents for annual exam. The patient has no complaints today.   LMP: No LMP recorded. Patient is postmenopausal. The patient is sexually active, however she and her husband of a few years, do not have intercourse wth penetration. She has discussed this with Dr. Gilman Schmidt, and was offered a referral to a sex therapist to address in the past. They do have intimacy outside of penetration, but she does  Bring up her inability to experience orgasm. She uses vaginal Estrogen cream as prescribed. The patient does perform self breast exams.  There is no notable family history of breast or ovarian cancer in her family.  The patient wears seatbelts: yes.   The patient has regular exercise: yes.    The patient denies current symptoms of depression.   She does take daily Fluoxetine  Review of Systems: Review of Systems  Constitutional: Negative.   HENT: Negative.   Eyes: Negative.   Respiratory: Negative.   Cardiovascular: Negative.   Gastrointestinal: Negative.   Genitourinary: Negative.   Musculoskeletal: Negative.   Skin: Negative.   Neurological: Negative.   Endo/Heme/Allergies: Negative.   Psychiatric/Behavioral: Negative.     Past Medical History:  Patient Active Problem List   Diagnosis Date Noted  . Vaginal dryness 01/02/2020  . Hypertension 12/09/2014  . Hyperlipidemia 12/09/2014    Past Surgical History:  Past Surgical History:  Procedure Laterality Date  . BREAST BIOPSY Right 07/13/2015   benign  . COLONOSCOPY WITH PROPOFOL N/A 09/20/2015   Procedure: COLONOSCOPY WITH PROPOFOL;  Surgeon: Lollie Sails, MD;  Location: [facility] ENDOSCOPY;  Service: Endoscopy;  Laterality: N/A;  . LIPOMA EXCISION Right 03/29/2018   Procedure: EXCISION LIPOMA- RIGHT FOREARM;  Surgeon:  Jovita Kussmaul, MD;  Location: [facility] ORS;  Service: General;  Laterality: Right;  . TUBAL LIGATION      Gynecologic History:  No LMP recorded. Patient is postmenopausal. Last Pap: Results were: NILM no abnormalities  Last mammogram: 2021 Results were: BI-RAD I  Obstetric History: [id number]  Family History:  Family History  Problem Relation Age of Onset  . Stroke Mother        or MI  . Heart disease Mother   . Alcohol abuse Father   . Alcohol abuse Sister   . Hypertension Son   . Breast cancer Neg Hx     Social History:  Social History   Socioeconomic History  . Marital status: Widowed    Spouse name: Not on file  . Number of children: Not on file  . Years of education: Not on file  . Highest education level: Not on file  Occupational History  . Not on file  Tobacco Use  . Smoking status: Former Smoker    Quit date: 09/19/1994    Years since quitting: 26.3  . Smokeless tobacco: Never Used  Vaping Use  . Vaping Use: Never used  Substance and Sexual Activity  . Alcohol use: No  . Drug use: No  . Sexual activity: Not Currently    Birth control/protection: Post-menopausal  Other Topics Concern  . Not on file  Social History Narrative  . Not on file   Social Determinants of Health   Financial Resource Strain: Not on file  Food Insecurity: Not on file  Transportation  Needs: Not on file  Physical Activity: Not on file  Stress: Not on file  Social Connections: Not on file  Intimate Partner Violence: Not on file    Allergies:  No Known Allergies  Medications: Prior to Admission medications   Medication Sig Start Date End Date Taking? Authorizing Provider  acetaminophen (TYLENOL) 500 MG tablet Take 1,000 mg by mouth daily as needed for moderate pain or headache.   Yes [provider]  atorvastatin (LIPITOR) 20 MG tablet Take 20 mg by mouth daily. 08/12/19  Yes [provider]  busPIRone (BUSPAR) 10 MG tablet Take 5 mg by mouth 2 (two) times  daily.   Yes [provider]  Calcium Carb-Cholecalciferol 600-400 MG-UNIT CAPS Take by mouth.   Yes [provider]  estradiol (ESTRACE) 0.1 MG/GM vaginal cream Place 1 g vaginally 2 (two) times a week. 12/23/19  Yes [provider]  FLUoxetine (PROZAC) 10 MG capsule Take 10 mg by mouth daily. 11/10/20  Yes [provider]  fluticasone (FLONASE) 50 MCG/ACT nasal spray Place 1 spray into both nostrils daily as needed for allergies or rhinitis.   Yes [provider]  hydrochlorothiazide (HYDRODIURIL) 12.5 MG tablet Take 12.5 mg by mouth daily. 12/16/19  Yes [provider]  lisinopril (PRINIVIL,ZESTRIL) 5 MG tablet Take 5 mg by mouth daily.   Yes [provider]  vitamin B-12 (CYANOCOBALAMIN) 500 MCG tablet Take 500 mcg by mouth daily.   Yes [provider]    Physical Exam Vitals: Blood pressure 110/70, height 4\' 11"  (1.499 m), weight 122 lb (55.3 kg).  General: NAD HEENT: normocephalic, anicteric Thyroid: no enlargement, no palpable nodules Pulmonary: No increased work of breathing, CTAB Cardiovascular: RRR, distal pulses 2+ Breast: Breast symmetrical, no tenderness, no palpable nodules or masses, no skin or nipple retraction present, no nipple discharge.  No axillary or supraclavicular lymphadenopathy. Abdomen: NABS, soft, non-tender, non-distended.  Umbilicus without lesions.  No hepatomegaly, splenomegaly or masses palpable. No evidence of hernia  Genitourinary:  External: Normal external female genitalia.  Normal urethral meatus, normal Bartholin's and Skene's glands.    Vagina: Normal vaginal mucosa, no evidence of prolapse.    Cervix: Grossly normal in appearance, no bleeding  Uterus: Non-enlarged, mobile, normal contour.  No CMT  Adnexa: ovaries non-enlarged, no adnexal masses  Rectal: deferred  Lymphatic: no evidence of inguinal lymphadenopathy Extremities: no edema, erythema, or tenderness Neurologic: Grossly  intact Psychiatric: mood appropriate, affect full  Female chaperone present for pelvic and breast  portions of the physical exam     Assessment: 70 y.o. G3P3003 routine annual exam  Plan: Problem List Items Addressed This Visit   None   Visit Diagnoses    Encounter for screening mammogram for breast cancer    -  Primary      1) Mammogram - recommend yearly screening mammogram.  Mammogram Is up to date  2) STI screening  was notoffered and therefore not obtained  3) ASCCP guidelines and rational discussed.  Patient opts for discontinue age >69 screening interval  4) Osteoporosis  - per USPTF routine screening DEXA at age 48 Handled and ordered by her PCP  Consider FDA-approved medical therapies in postmenopausal women and men aged 39 years and older, based on the following: a) A hip or vertebral (clinical or morphometric) fracture b) T-score ? -2.5 at the femoral neck or spine after appropriate evaluation to exclude secondary causes C) Low bone mass (T-score between -1.0 and -2.5 at the femoral neck or  spine) and a 10-year probability of a hip fracture ? 3% or a 10-year probability of a major osteoporosis-related fracture ? 20% based on the [place]-adapted WHO algorithm   5) Routine healthcare maintenance including cholesterol, diabetes screening discussed managed by PCP  6) Colonoscopy per her PCP. This is up to date.  Screening recommended starting at age 76 for average risk individuals, age 64 for individuals deemed at increased risk (including African Americans) and recommended to continue until age 94.  For patient age 39-85 individualized approach is recommended.  Gold standard screening is via colonoscopy, Cologuard screening is an acceptable alternative for patient unwilling or unable to undergo colonoscopy.  "Colorectal cancer screening for average?risk adults: 2018 guideline update from the American Cancer Society"CA: A Cancer Journal for Clinicians: Jan 31, 2017   7) Return  in about 1 year (around 01/04/2022) for annual.  8.  We discussed her inability to achieve orgasm, and a medication review included a discussion of her daily use of Fluoxetine. She  Is provided information on referral to a sex therapist, as well as a suggestion that she discuss a trial of Wellbutrin rather than Prozac to address daily anxiety /depressive symptoms, as SSRIs can be associated with inability to achieve orgasm.    Imagene Riches, CNM  01/04/2021 5:33 PM   [facility], [facility] 01/04/2021, 5:26 PM

## 2021-01-07 DIAGNOSIS — E782 Mixed hyperlipidemia: Secondary | ICD-10-CM | POA: Diagnosis not present

## 2021-01-07 DIAGNOSIS — I10 Essential (primary) hypertension: Secondary | ICD-10-CM | POA: Diagnosis not present

## 2021-02-11 DIAGNOSIS — I10 Essential (primary) hypertension: Secondary | ICD-10-CM | POA: Diagnosis not present

## 2021-02-21 DIAGNOSIS — H5203 Hypermetropia, bilateral: Secondary | ICD-10-CM | POA: Diagnosis not present

## 2021-02-21 DIAGNOSIS — H2513 Age-related nuclear cataract, bilateral: Secondary | ICD-10-CM | POA: Diagnosis not present

## 2021-05-12 DIAGNOSIS — I10 Essential (primary) hypertension: Secondary | ICD-10-CM | POA: Diagnosis not present

## 2021-05-12 DIAGNOSIS — Z23 Encounter for immunization: Secondary | ICD-10-CM | POA: Diagnosis not present

## 2021-05-12 DIAGNOSIS — R7302 Impaired glucose tolerance (oral): Secondary | ICD-10-CM | POA: Diagnosis not present

## 2021-05-12 DIAGNOSIS — E782 Mixed hyperlipidemia: Secondary | ICD-10-CM | POA: Diagnosis not present

## 2021-06-14 DIAGNOSIS — I10 Essential (primary) hypertension: Secondary | ICD-10-CM | POA: Diagnosis not present

## 2021-06-14 DIAGNOSIS — E782 Mixed hyperlipidemia: Secondary | ICD-10-CM | POA: Diagnosis not present

## 2021-07-13 ENCOUNTER — Other Ambulatory Visit: Payer: Self-pay | Admitting: Internal Medicine

## 2021-07-13 DIAGNOSIS — Z1231 Encounter for screening mammogram for malignant neoplasm of breast: Secondary | ICD-10-CM

## 2021-08-11 DIAGNOSIS — R7302 Impaired glucose tolerance (oral): Secondary | ICD-10-CM | POA: Diagnosis not present

## 2021-08-11 DIAGNOSIS — I10 Essential (primary) hypertension: Secondary | ICD-10-CM | POA: Diagnosis not present

## 2021-08-11 DIAGNOSIS — E782 Mixed hyperlipidemia: Secondary | ICD-10-CM | POA: Diagnosis not present

## 2021-08-17 ENCOUNTER — Other Ambulatory Visit: Payer: Self-pay

## 2021-08-17 ENCOUNTER — Ambulatory Visit
Admission: RE | Admit: 2021-08-17 | Discharge: 2021-08-17 | Disposition: A | Discharge status: Home / Self Care | Payer: Medicare Other | Source: Ambulatory Visit | Attending: Internal Medicine | Admitting: Internal Medicine

## 2021-08-17 DIAGNOSIS — Z1231 Encounter for screening mammogram for malignant neoplasm of breast: Secondary | ICD-10-CM | POA: Diagnosis not present

## 2021-08-23 DIAGNOSIS — C44519 Basal cell carcinoma of skin of other part of trunk: Secondary | ICD-10-CM | POA: Diagnosis not present

## 2021-08-23 DIAGNOSIS — D485 Neoplasm of uncertain behavior of skin: Secondary | ICD-10-CM | POA: Diagnosis not present

## 2021-08-23 DIAGNOSIS — L821 Other seborrheic keratosis: Secondary | ICD-10-CM | POA: Diagnosis not present

## 2021-08-23 DIAGNOSIS — D0461 Carcinoma in situ of skin of right upper limb, including shoulder: Secondary | ICD-10-CM | POA: Diagnosis not present

## 2021-09-22 DIAGNOSIS — I10 Essential (primary) hypertension: Secondary | ICD-10-CM | POA: Diagnosis not present

## 2021-09-22 DIAGNOSIS — E782 Mixed hyperlipidemia: Secondary | ICD-10-CM | POA: Diagnosis not present

## 2021-10-20 DIAGNOSIS — D0461 Carcinoma in situ of skin of right upper limb, including shoulder: Secondary | ICD-10-CM | POA: Diagnosis not present

## 2021-10-20 DIAGNOSIS — C44622 Squamous cell carcinoma of skin of right upper limb, including shoulder: Secondary | ICD-10-CM | POA: Diagnosis not present

## 2021-11-03 DIAGNOSIS — C4491 Basal cell carcinoma of skin, unspecified: Secondary | ICD-10-CM | POA: Diagnosis not present

## 2021-11-03 DIAGNOSIS — C44519 Basal cell carcinoma of skin of other part of trunk: Secondary | ICD-10-CM | POA: Diagnosis not present

## 2021-11-16 DIAGNOSIS — C44622 Squamous cell carcinoma of skin of right upper limb, including shoulder: Secondary | ICD-10-CM | POA: Diagnosis not present

## 2021-12-06 DIAGNOSIS — C44622 Squamous cell carcinoma of skin of right upper limb, including shoulder: Secondary | ICD-10-CM | POA: Diagnosis not present

## 2021-12-22 DIAGNOSIS — E782 Mixed hyperlipidemia: Secondary | ICD-10-CM | POA: Diagnosis not present

## 2021-12-22 DIAGNOSIS — I10 Essential (primary) hypertension: Secondary | ICD-10-CM | POA: Diagnosis not present

## 2021-12-22 DIAGNOSIS — R7302 Impaired glucose tolerance (oral): Secondary | ICD-10-CM | POA: Diagnosis not present

## 2021-12-22 DIAGNOSIS — E559 Vitamin D deficiency, unspecified: Secondary | ICD-10-CM | POA: Diagnosis not present

## 2022-02-09 ENCOUNTER — Ambulatory Visit (INDEPENDENT_AMBULATORY_CARE_PROVIDER_SITE_OTHER): Payer: Medicare Other | Admitting: Obstetrics

## 2022-02-09 ENCOUNTER — Encounter: Payer: Self-pay | Admitting: Obstetrics

## 2022-02-09 ENCOUNTER — Ambulatory Visit: Payer: Medicare Other | Admitting: Obstetrics

## 2022-02-09 VITALS — BP 128/72 | Ht 59.0 in | Wt 131.0 lb

## 2022-02-09 DIAGNOSIS — Z13820 Encounter for screening for osteoporosis: Secondary | ICD-10-CM

## 2022-02-09 DIAGNOSIS — Z1331 Encounter for screening for depression: Secondary | ICD-10-CM

## 2022-02-09 DIAGNOSIS — Z01411 Encounter for gynecological examination (general) (routine) with abnormal findings: Secondary | ICD-10-CM | POA: Diagnosis not present

## 2022-02-09 NOTE — Progress Notes (Signed)
Chief Complaint  Patient presents with   Gynecologic Exam   Patient Allison Harvey is an 71 y.o. year old G57P3003 menopausal patient with No LMP recorded. Patient is postmenopausal. who presents for annual. She is exercising regularly. She does perform self breast exam. She takes Vitamin D. She reports vasomotor symptoms, and vaginal dryness. She is sexually active with no problems except dryness.  Primary care provider: Perrin Maltese, MD  Review of Systems  Constitutional:  Positive for diaphoresis. Negative for activity change, appetite change, chills, fatigue, fever and unexpected weight change.  HENT:  Positive for hearing loss and sneezing. Negative for congestion, dental problem, drooling, ear discharge, ear pain, facial swelling, mouth sores, nosebleeds, postnasal drip, rhinorrhea, sinus pressure, sinus pain, sore throat, tinnitus, trouble swallowing and voice change.   Eyes:  Positive for visual disturbance. Negative for photophobia, pain, discharge, redness and itching.  Respiratory:  Negative for apnea, cough, choking, chest tightness, shortness of breath, wheezing and stridor.   Cardiovascular:  Negative for chest pain, palpitations and leg swelling.  Gastrointestinal:  Negative for abdominal distention, abdominal pain, anal bleeding, blood in stool, constipation, diarrhea, nausea, rectal pain and vomiting.  Endocrine: Negative for cold intolerance, heat intolerance, polydipsia, polyphagia and polyuria.  Genitourinary:  Negative for decreased urine volume, difficulty urinating, dyspareunia, dysuria, enuresis, flank pain, frequency, genital sores, hematuria, menstrual problem, pelvic pain, urgency, vaginal bleeding, vaginal discharge and vaginal pain.  Musculoskeletal:  Negative for arthralgias, back pain, gait problem, joint swelling, myalgias, neck pain and neck stiffness.  Skin:  Positive for rash. Negative for color change, pallor and wound.  Allergic/Immunologic: Negative for  environmental allergies, food allergies and immunocompromised state.  Neurological:  Positive for numbness. Negative for dizziness, tremors, seizures, syncope, facial asymmetry, speech difficulty, weakness, light-headedness and headaches.  Hematological:  Negative for adenopathy. Bruises/bleeds easily.  Psychiatric/Behavioral:  Negative for agitation, behavioral problems, confusion, decreased concentration, dysphoric mood, hallucinations, self-injury, sleep disturbance and suicidal ideas. The patient is nervous/anxious. The patient is not hyperactive.    Menstrual History She denies postmenopausal bleeding or cramping. Age at menopause: 43  She denies abnormal paps or pelvic infections. She denies domestic violence or sexual abuse.  Health Maintenance  Topic Date Due   Hepatitis C Screening  Never done   TETANUS/TDAP  Never done   Pneumonia Vaccine 9+ Years old (1 - PCV) Never done   DEXA SCAN  Never done   Zoster Vaccines- Shingrix (2 of 2) 09/29/2019   COVID-19 Vaccine (3 - Moderna series) 02/19/2020   INFLUENZA VACCINE  04/04/2022   MAMMOGRAM  08/18/2023   COLONOSCOPY (Pts 45-57yr Insurance coverage will need to be confirmed)  09/19/2025   HPV VACCINES  Aged Out    Past Medical History:  Diagnosis Date   Anxiety    Cataract    Depression    GERD (gastroesophageal reflux disease)    H/O seasonal allergies    Hyperlipidemia    Hypertension    Past Surgical History:  Procedure Laterality Date   BREAST BIOPSY Right 07/13/2015   benign   COLONOSCOPY WITH PROPOFOL N/A 09/20/2015   Procedure: COLONOSCOPY WITH PROPOFOL;  Surgeon: MLollie Sails MD;  [facility];  Service: Endoscopy;  Laterality: N/A;   LIPOMA EXCISION Right 03/29/2018   Procedure: EXCISION LIPOMA- RIGHT FOREARM;  Surgeon: TJovita Kussmaul MD;  Location: [facility] ORS;  Service: General;  Laterality: Right;   TUBAL LIGATION     Family History  Problem Relation Age of Onset  Stroke Mother         or MI   Heart disease Mother    Alcohol abuse Father    Alcohol abuse Sister    Hypertension Son    Breast cancer Neg Hx    Social History   Socioeconomic History   Marital status: Widowed    Spouse name: Not on file   Number of children: Not on file   Years of education: Not on file   Highest education level: Not on file  Occupational History   Not on file  Tobacco Use   Smoking status: Former    Types: Cigarettes    Quit date: 09/19/1994    Years since quitting: 27.4   Smokeless tobacco: Never  Vaping Use   Vaping Use: Never used  Substance and Sexual Activity   Alcohol use: No   Drug use: No   Sexual activity: Not Currently    Birth control/protection: Post-menopausal  Other Topics Concern   Not on file  Social History Narrative   Not on file   Social Determinants of Health   Financial Resource Strain: Not on file  Food Insecurity: Not on file  Transportation Needs: Not on file  Physical Activity: Not on file  Stress: Not on file  Social Connections: Not on file  Intimate Partner Violence: Not on file     Medicine list and allergies reviewed and updated.    Objective:  BP 128/72   Ht '4\' 11"'$  (1.499 m)   Wt 131 lb (59.4 kg)   BMI 26.46 kg/m     02/09/2022    4:01 PM  Depression screen PHQ 2/9  Decreased Interest 2  Down, Depressed, Hopeless 1  PHQ - 2 Score 3  Altered sleeping 0  Tired, decreased energy 3  Change in appetite 3  Feeling bad or failure about yourself  1  Trouble concentrating 2  Moving slowly or fidgety/restless 1  Suicidal thoughts 0  PHQ-9 Score 13      02/09/2022    4:02 PM  GAD 7 : Generalized Anxiety Score  Nervous, Anxious, on Edge 1  Control/stop worrying 1  Worry too much - different things 1  Trouble relaxing 1  Restless 1  Easily annoyed or irritable 0  Afraid - awful might happen 1  Total GAD 7 Score 6  Anxiety Difficulty Not difficult at all    Physical Exam Vitals and nursing note reviewed. Exam conducted  with a chaperone present.  Constitutional:      General: She is not in acute distress.    Appearance: Normal appearance. She is not ill-appearing.  HENT:     Head: Normocephalic and atraumatic.     Mouth/Throat:     Mouth: Mucous membranes are moist.     Pharynx: No oropharyngeal exudate.  Eyes:     Extraocular Movements: Extraocular movements intact.  Neck:     Thyroid: No thyromegaly.  Cardiovascular:     Rate and Rhythm: Normal rate and regular rhythm.     Heart sounds: Normal heart sounds.  Pulmonary:     Effort: Pulmonary effort is normal.     Breath sounds: Normal breath sounds.  Chest:     Chest wall: No mass or tenderness.  Breasts:    Breasts are symmetrical.     Right: Normal. No mass, nipple discharge, skin change or tenderness.     Left: Normal. No mass, nipple discharge, skin change or tenderness.  Abdominal:     General: There is  no distension.     Palpations: There is no mass.     Tenderness: There is no abdominal tenderness. There is no guarding or rebound.     Hernia: No hernia is present.  Genitourinary:    Exam position: Lithotomy position.     Labia:        Right: No rash, tenderness or lesion.        Left: No rash, tenderness or lesion.      Urethra: No prolapse or urethral lesion.     Vagina: Normal. No vaginal discharge, tenderness, lesions or prolapsed vaginal walls.     Cervix: No discharge, lesion or cervical bleeding.     Uterus: Normal. Not enlarged, not tender and no uterine prolapse.      Adnexa:        Right: No tenderness or fullness.         Left: No tenderness or fullness.       Rectum: Normal. No tenderness or external hemorrhoid. Normal anal tone.     Comments: External genitalia - atrophic with normal hair distribution, no lesions noted Urethral meatus - no prolapse, no lesions noted  Urethra - no tenderness or scarring noted, no masses noted  Bladder - normal, no masses or tenderness noted Vagina -  atrophic with decreased rugae, no  discharge, no lesions noted, pelvic relaxation Cervix - small with no lesions noted, atrophic  Uterus - small, nontender, mobile with adequate pelvic support  Adnexa - no masses palpated, no tenderness Rectal - normal sphincter tone, no hemorrhoids or masses noted Musculoskeletal:        General: Normal range of motion.     Cervical back: Normal range of motion. No tenderness.     Right lower leg: No edema.     Left lower leg: No edema.  Lymphadenopathy:     Cervical: No cervical adenopathy.     Upper Body:     Right upper body: No axillary adenopathy.     Left upper body: No axillary adenopathy.     Lower Body: No right inguinal adenopathy. No left inguinal adenopathy.  Skin:    General: Skin is warm and dry.  Neurological:     General: No focal deficit present.     Mental Status: She is alert and oriented to person, place, and time. Mental status is at baseline.     Coordination: Coordination normal.     Deep Tendon Reflexes: Reflexes normal.  Psychiatric:        Mood and Affect: Mood normal.        Behavior: Behavior normal.        Thought Content: Thought content normal.        Judgment: Judgment normal.     Assessment/Plan:   Encounter for gynecological examination with abnormal finding  Screening for osteoporosis - Plan: DG Bone Density  Positive depression screening  No pap needed age >69. Breast exam recommended. Mammogram due 12/23 Daily multivitamin recommended. Calcium and Vitamin D recommended. Colonoscopy UTD  Bone density ordered FRAX score is calculated as : 12 % risk of Major osteoporotic fracture /2.9 % risk of hip fracture Wellness labs per PCP Exercise discussed with patient.  Discussed her depression screen, currently on buspar which she feels helps her mood; recommend consideration of following with therapist   Return in about 1 year (around 02/10/2023), or if symptoms worsen or fail to improve, for annual/well woman.

## 2022-02-09 NOTE — Patient Instructions (Signed)
Have a great year! Please call with any concerns. Don't forget to wear your seatbelt everyday! If you are not signed up on MyChart, please ask [place] how to sign up for it!   In a world where you can be anything, please be kind.   Body mass index is 26.46 kg/m.  A Healthy Lifestyle: Care Instructions Your Care Instructions  A healthy lifestyle can help you feel good, stay at a healthy weight, and have plenty of energy for both work and play. A healthy lifestyle is something you can share with your whole family. A healthy lifestyle also can lower your risk for serious health problems, such as high blood pressure, heart disease, and diabetes. You can follow a few steps listed below to improve your health and the health of your family. Follow-up care is a key part of your treatment and safety. Be sure to make and go to all appointments, and call your doctor if you are having problems. It's also a good idea to know your test results and keep a list of the medicines you take. How can you care for yourself at home? Do not eat too much sugar, fat, or fast foods. You can still have dessert and treats now and then. The goal is moderation. Start small to improve your eating habits. Pay attention to portion sizes, drink less juice and soda pop, and eat more fruits and vegetables. Eat a healthy amount of food. A 3-ounce serving of meat, for example, is about the size of a deck of cards. Fill the rest of your plate with vegetables and whole grains. Limit the amount of soda and sports drinks you have every day. Drink more water when you are thirsty. Eat at least 5 servings of fruits and vegetables every day. It may seem like a lot, but it is not hard to reach this goal. A serving or helping is 1 piece of fruit, 1 cup of vegetables, or 2 cups of leafy, raw vegetables. Have an apple or some carrot sticks as an afternoon snack instead of a candy bar. Try to have fruits and/or vegetables at every meal. Make exercise  part of your daily routine. You may want to start with simple activities, such as walking, bicycling, or slow swimming. Try to be active 30 to 60 minutes every day. You do not need to do all 30 to 60 minutes all at once. For example, you can exercise 3 times a day for 10 or 20 minutes. Moderate exercise is safe for most people, but it is always a good idea to talk to your doctor before starting an exercise program. Keep moving. Mow the lawn, work in the garden, or [employer]. Take the stairs instead of the elevator at work. If you smoke, quit. People who smoke have an increased risk for heart attack, stroke, cancer, and other lung illnesses. Quitting is hard, but there are ways to boost your chance of quitting tobacco for good. Use nicotine gum, patches, or lozenges. Ask your doctor about stop-smoking programs and medicines. Keep trying. In addition to reducing your risk of diseases in the future, you will notice some benefits soon after you stop using tobacco. If you have shortness of breath or asthma symptoms, they will likely get better within a few weeks after you quit. Limit how much alcohol you drink. Moderate amounts of alcohol (up to 2 drinks a day for men, 1 drink a day for women) are okay. But drinking too much can lead to  liver problems, high blood pressure, and other health problems. Family health If you have a family, there are many things you can do together to improve your health. Eat meals together as a family as often as possible. Eat healthy foods. This includes fruits, vegetables, lean meats and dairy, and whole grains. Include your family in your fitness plan. Most people think of activities such as jogging or tennis as the way to fitness, but there are many ways you and your family can be more active. Anything that makes you breathe hard and gets your heart pumping is exercise. Here are some tips: Walk to do errands or to take your child to school or the bus. Go for a family  bike ride after dinner instead of watching TV. Care instructions adapted under license by your healthcare professional. This care instruction is for use with your licensed healthcare professional. If you have questions about a medical condition or this instruction, always ask your healthcare professional. [place] any warranty or liability for your use of this information.

## 2022-02-15 ENCOUNTER — Telehealth: Payer: Self-pay

## 2022-02-15 NOTE — Telephone Encounter (Signed)
Allison Harvey called the after hour line, stating she had a bone density test done on 04/2020 and that it may be too early for another one.

## 2022-02-16 NOTE — Telephone Encounter (Signed)
Noted yes if abnormal recommend rpt 2-3 yrs from last

## 2022-04-11 DIAGNOSIS — E782 Mixed hyperlipidemia: Secondary | ICD-10-CM | POA: Diagnosis not present

## 2022-04-11 DIAGNOSIS — R7302 Impaired glucose tolerance (oral): Secondary | ICD-10-CM | POA: Diagnosis not present

## 2022-04-11 DIAGNOSIS — I10 Essential (primary) hypertension: Secondary | ICD-10-CM | POA: Diagnosis not present

## 2022-04-11 DIAGNOSIS — R109 Unspecified abdominal pain: Secondary | ICD-10-CM | POA: Diagnosis not present

## 2022-04-18 DIAGNOSIS — R109 Unspecified abdominal pain: Secondary | ICD-10-CM | POA: Diagnosis not present

## 2022-05-09 DIAGNOSIS — I10 Essential (primary) hypertension: Secondary | ICD-10-CM | POA: Diagnosis not present

## 2022-05-09 DIAGNOSIS — E782 Mixed hyperlipidemia: Secondary | ICD-10-CM | POA: Diagnosis not present

## 2022-06-05 ENCOUNTER — Other Ambulatory Visit: Payer: Self-pay | Admitting: Internal Medicine

## 2022-06-05 DIAGNOSIS — Z23 Encounter for immunization: Secondary | ICD-10-CM | POA: Diagnosis not present

## 2022-06-05 DIAGNOSIS — I10 Essential (primary) hypertension: Secondary | ICD-10-CM | POA: Diagnosis not present

## 2022-06-05 DIAGNOSIS — Z1231 Encounter for screening mammogram for malignant neoplasm of breast: Secondary | ICD-10-CM

## 2022-06-05 DIAGNOSIS — Z0000 Encounter for general adult medical examination without abnormal findings: Secondary | ICD-10-CM | POA: Diagnosis not present

## 2022-06-05 DIAGNOSIS — R7302 Impaired glucose tolerance (oral): Secondary | ICD-10-CM | POA: Diagnosis not present

## 2022-06-05 DIAGNOSIS — N951 Menopausal and female climacteric states: Secondary | ICD-10-CM

## 2022-08-21 ENCOUNTER — Ambulatory Visit
Admission: RE | Admit: 2022-08-21 | Discharge: 2022-08-21 | Disposition: A | Discharge status: Home / Self Care | Payer: Medicare Other | Source: Ambulatory Visit | Attending: Internal Medicine | Admitting: Internal Medicine

## 2022-08-21 DIAGNOSIS — Z1231 Encounter for screening mammogram for malignant neoplasm of breast: Secondary | ICD-10-CM | POA: Insufficient documentation

## 2022-08-23 ENCOUNTER — Encounter: Payer: Self-pay | Admitting: Internal Medicine

## 2022-08-24 ENCOUNTER — Other Ambulatory Visit: Payer: Self-pay | Admitting: Internal Medicine

## 2022-08-24 DIAGNOSIS — R928 Other abnormal and inconclusive findings on diagnostic imaging of breast: Secondary | ICD-10-CM

## 2022-08-30 ENCOUNTER — Ambulatory Visit
Admission: RE | Admit: 2022-08-30 | Discharge: 2022-08-30 | Disposition: A | Discharge status: Home / Self Care | Payer: Medicare Other | Source: Ambulatory Visit | Attending: Internal Medicine | Admitting: Internal Medicine

## 2022-08-30 DIAGNOSIS — R922 Inconclusive mammogram: Secondary | ICD-10-CM | POA: Diagnosis not present

## 2022-08-30 DIAGNOSIS — R928 Other abnormal and inconclusive findings on diagnostic imaging of breast: Secondary | ICD-10-CM | POA: Diagnosis not present

## 2022-09-06 ENCOUNTER — Ambulatory Visit
Admission: RE | Admit: 2022-09-06 | Discharge: 2022-09-06 | Disposition: A | Discharge status: Home / Self Care | Payer: Medicare Other | Source: Ambulatory Visit | Attending: Internal Medicine | Admitting: Internal Medicine

## 2022-09-06 DIAGNOSIS — M8589 Other specified disorders of bone density and structure, multiple sites: Secondary | ICD-10-CM | POA: Diagnosis not present

## 2022-09-06 DIAGNOSIS — N951 Menopausal and female climacteric states: Secondary | ICD-10-CM

## 2022-09-06 DIAGNOSIS — Z780 Asymptomatic menopausal state: Secondary | ICD-10-CM | POA: Diagnosis not present

## 2022-09-06 DIAGNOSIS — Z13820 Encounter for screening for osteoporosis: Secondary | ICD-10-CM | POA: Insufficient documentation

## 2022-09-19 DIAGNOSIS — R7302 Impaired glucose tolerance (oral): Secondary | ICD-10-CM | POA: Diagnosis not present

## 2022-09-19 DIAGNOSIS — H6123 Impacted cerumen, bilateral: Secondary | ICD-10-CM | POA: Diagnosis not present

## 2022-09-19 DIAGNOSIS — E782 Mixed hyperlipidemia: Secondary | ICD-10-CM | POA: Diagnosis not present

## 2022-09-19 DIAGNOSIS — I10 Essential (primary) hypertension: Secondary | ICD-10-CM | POA: Diagnosis not present

## 2022-09-27 DIAGNOSIS — R7302 Impaired glucose tolerance (oral): Secondary | ICD-10-CM | POA: Diagnosis not present

## 2022-09-27 DIAGNOSIS — I10 Essential (primary) hypertension: Secondary | ICD-10-CM | POA: Diagnosis not present

## 2022-09-27 DIAGNOSIS — H6123 Impacted cerumen, bilateral: Secondary | ICD-10-CM | POA: Diagnosis not present

## 2022-09-27 DIAGNOSIS — E782 Mixed hyperlipidemia: Secondary | ICD-10-CM | POA: Diagnosis not present

## 2022-10-04 DIAGNOSIS — H6123 Impacted cerumen, bilateral: Secondary | ICD-10-CM | POA: Diagnosis not present

## 2022-10-10 IMAGING — MG DIGITAL SCREENING BILAT W/ TOMO W/ CAD
8 series · 9 of 24 positions shown · non-contrast
Comparison: Previous exam(s).

CLINICAL DATA: Screening.

EXAM:
DIGITAL SCREENING BILATERAL MAMMOGRAM WITH TOMO AND CAD

[L CC synth-2D]
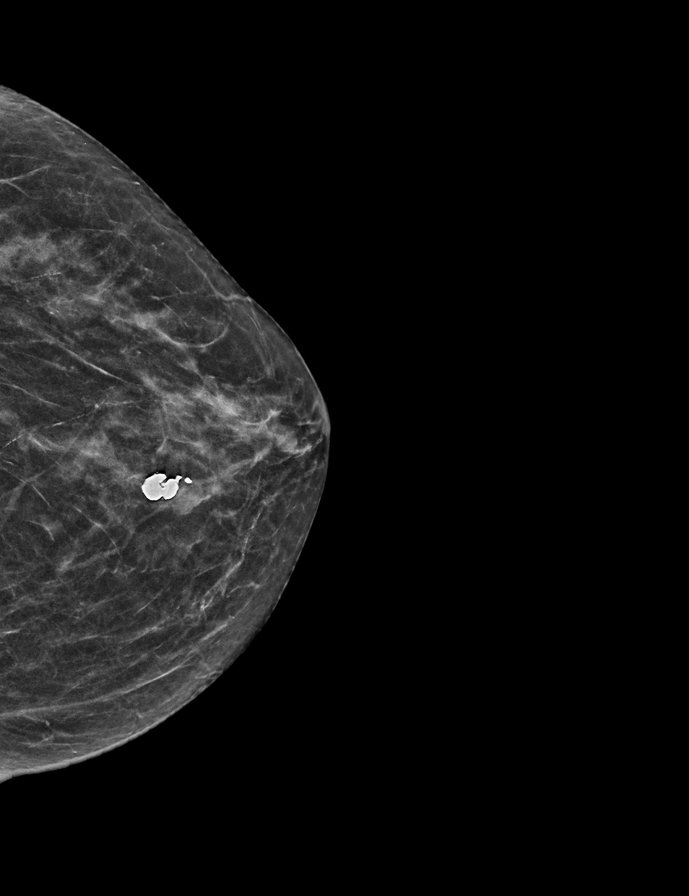

[R CC synth-2D]
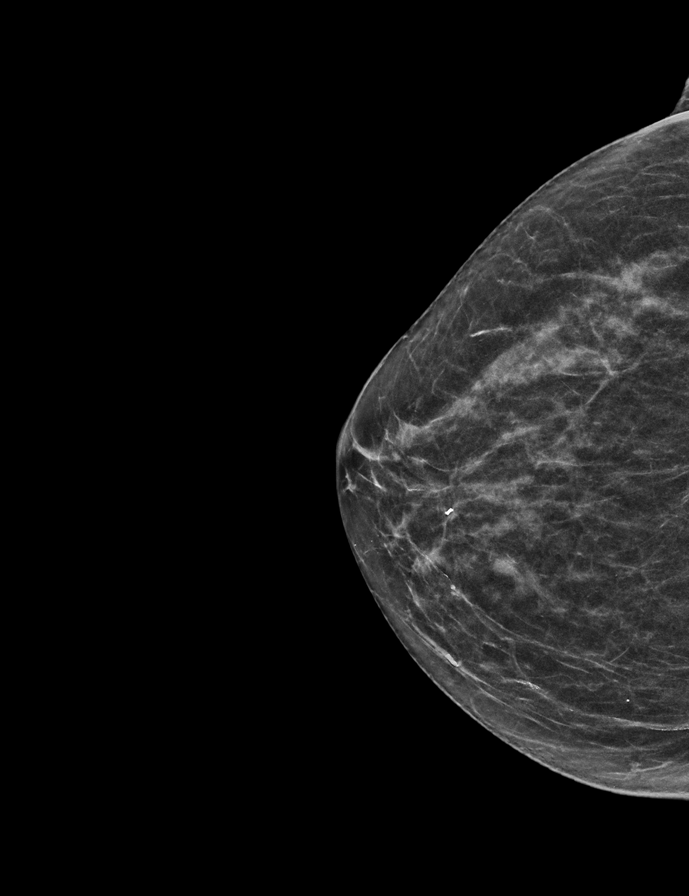

[L MLO synth-2D]
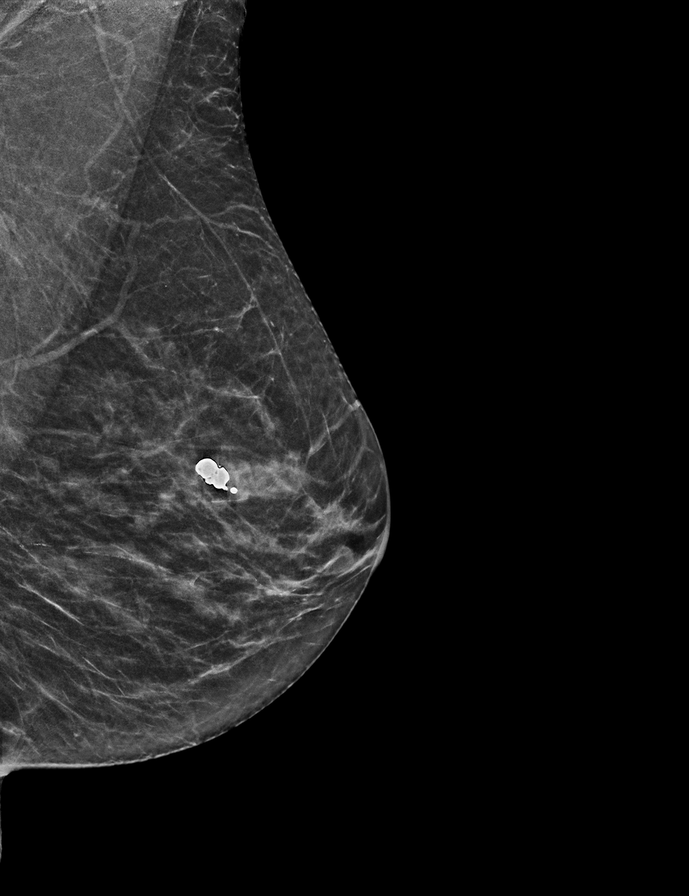

[R MLO synth-2D]
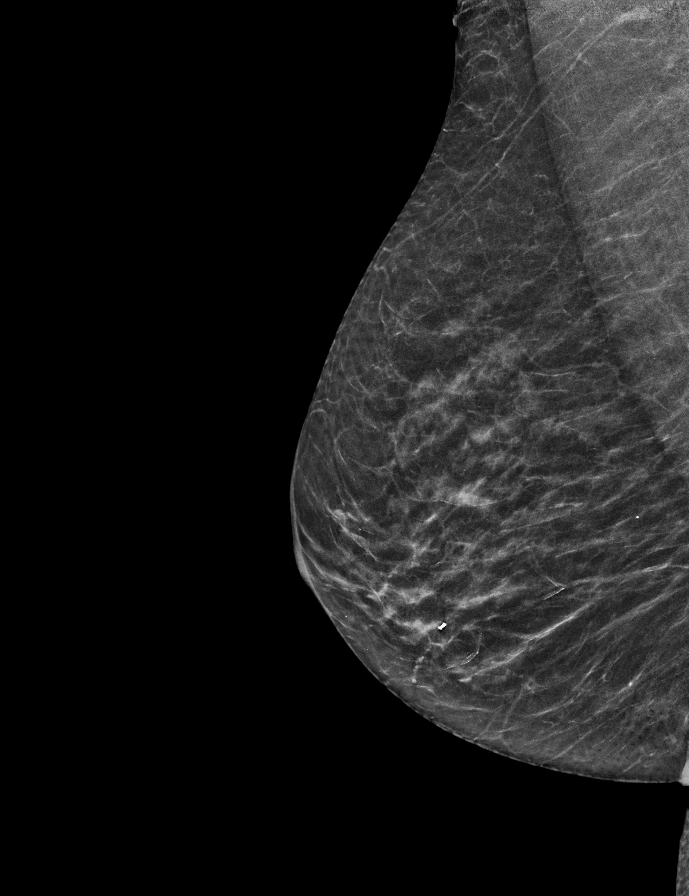

[R MLO tomo · 2 of 46 frames shown]
[frame 15/46]
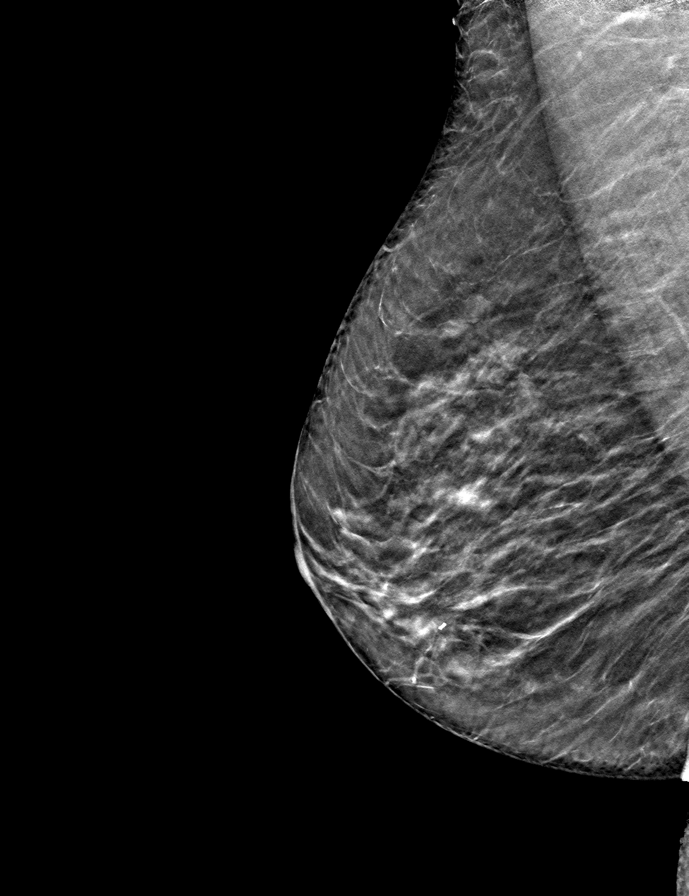
[frame 23/46]
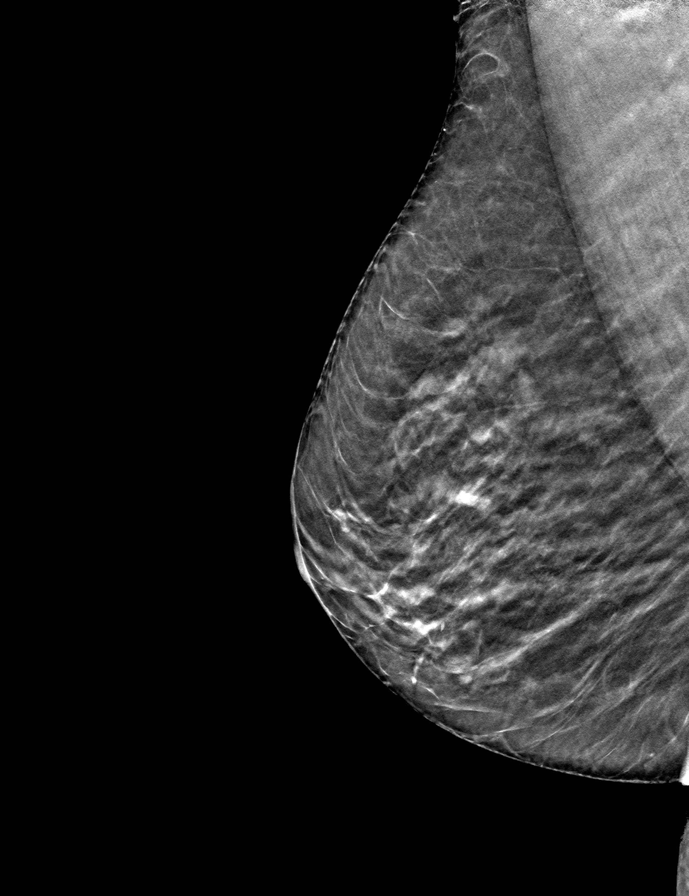

[L CC tomo · tomo slice 23/44.0]
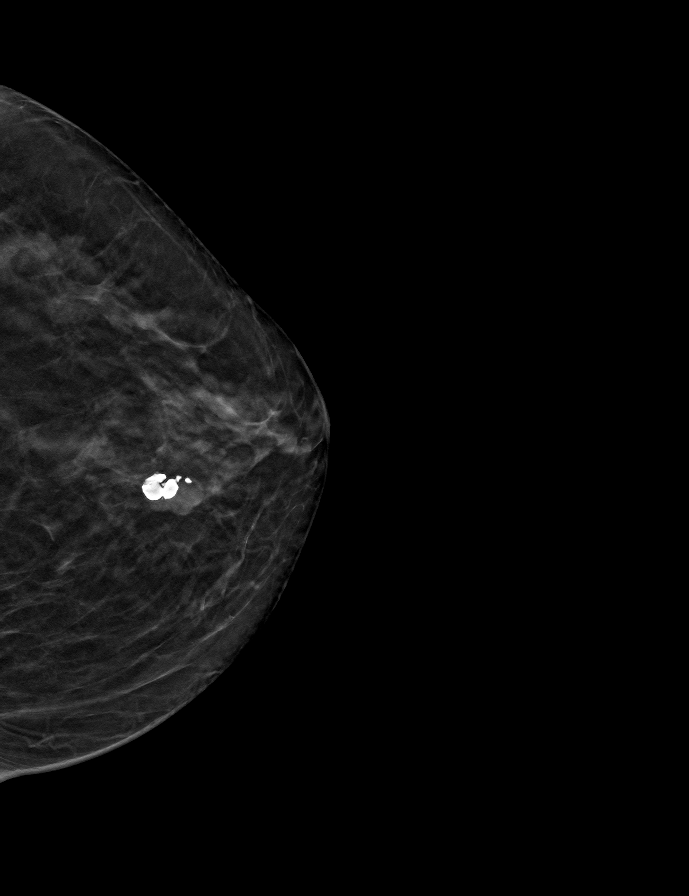

[R CC tomo · tomo slice 23/46.0]
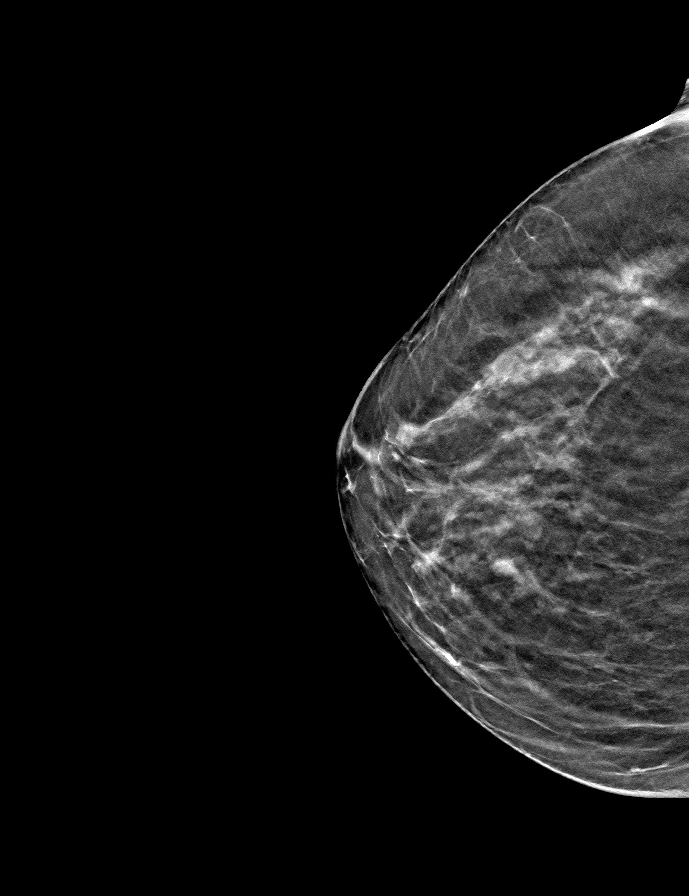

[L MLO tomo · tomo slice 23/46.0]
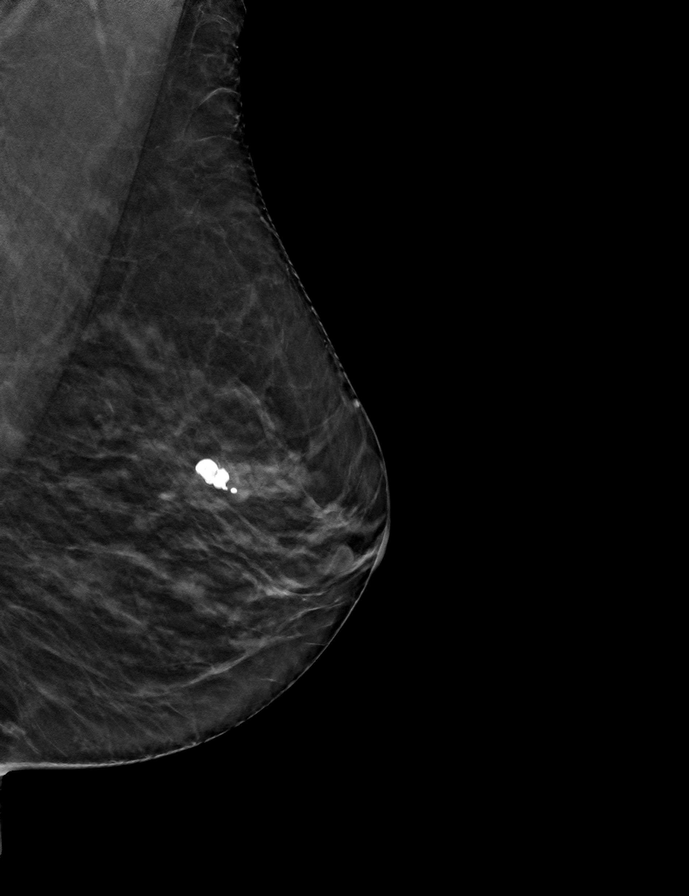

[9 of 24 positions shown; findings below may reference images not displayed]

ACR Breast Density Category b: There are scattered areas of
fibroglandular density.
FINDINGS: There are no findings suspicious for malignancy. Images were
processed with CAD.
IMPRESSION: No mammographic evidence of malignancy. A result letter of this
screening mammogram will be mailed directly to the patient.

RECOMMENDATION:
Screening mammogram in one year. (Code:[id number])

BI-RADS CATEGORY  1: Negative.

## 2022-10-11 ENCOUNTER — Ambulatory Visit: Payer: Medicare Other

## 2022-10-12 ENCOUNTER — Other Ambulatory Visit: Payer: Self-pay | Admitting: Internal Medicine

## 2022-10-12 ENCOUNTER — Telehealth: Payer: Self-pay

## 2022-10-12 DIAGNOSIS — H9113 Presbycusis, bilateral: Secondary | ICD-10-CM

## 2022-10-12 NOTE — Telephone Encounter (Signed)
Pt informed

## 2022-10-12 NOTE — Telephone Encounter (Signed)
Pt called and left vm regarding having issues with her ears recently, asked if we can send referral to ENT for her due to ongoing irritation in both ears please advise

## 2022-10-31 ENCOUNTER — Other Ambulatory Visit: Payer: Self-pay | Admitting: Internal Medicine

## 2022-10-31 ENCOUNTER — Telehealth: Payer: Self-pay

## 2022-10-31 DIAGNOSIS — B001 Herpesviral vesicular dermatitis: Secondary | ICD-10-CM

## 2022-10-31 MED ORDER — VALACYCLOVIR HCL 1 G PO TABS: 2000.0000 mg | ORAL_TABLET | Freq: Two times a day (BID) | ORAL | 0 refills | Status: AC

## 2022-10-31 NOTE — Telephone Encounter (Signed)
Pt called and left vm asking if you can send rx for cold sores/fever blisters? Please advise

## 2022-11-07 DIAGNOSIS — H6122 Impacted cerumen, left ear: Secondary | ICD-10-CM | POA: Diagnosis not present

## 2022-11-07 DIAGNOSIS — H903 Sensorineural hearing loss, bilateral: Secondary | ICD-10-CM | POA: Diagnosis not present

## 2022-11-21 ENCOUNTER — Other Ambulatory Visit: Payer: Self-pay | Admitting: Internal Medicine

## 2022-11-21 DIAGNOSIS — I10 Essential (primary) hypertension: Secondary | ICD-10-CM

## 2022-12-09 ENCOUNTER — Other Ambulatory Visit: Payer: Self-pay | Admitting: Internal Medicine

## 2023-01-04 ENCOUNTER — Telehealth: Payer: Self-pay

## 2023-01-04 ENCOUNTER — Other Ambulatory Visit: Payer: Self-pay | Admitting: Internal Medicine

## 2023-01-04 DIAGNOSIS — M25511 Pain in right shoulder: Secondary | ICD-10-CM

## 2023-01-04 NOTE — Telephone Encounter (Signed)
Pt came by asking if you can send a referral to ortho for her, has had right shoulder pain on & off for about 3 months now. Said it doesn't hurt all the time, but does notice the pain when her shoulder catches? She mentioned getting an injection, said she has tried doing different stretches to help the pain but still notices the pain. Please advise

## 2023-01-05 ENCOUNTER — Other Ambulatory Visit: Payer: Self-pay | Admitting: Internal Medicine

## 2023-01-05 DIAGNOSIS — E782 Mixed hyperlipidemia: Secondary | ICD-10-CM

## 2023-01-11 DIAGNOSIS — M7581 Other shoulder lesions, right shoulder: Secondary | ICD-10-CM | POA: Diagnosis not present

## 2023-01-13 ENCOUNTER — Other Ambulatory Visit: Payer: Self-pay | Admitting: Family

## 2023-01-26 ENCOUNTER — Ambulatory Visit (INDEPENDENT_AMBULATORY_CARE_PROVIDER_SITE_OTHER): Payer: 59 | Admitting: Internal Medicine

## 2023-01-26 ENCOUNTER — Encounter: Payer: Self-pay | Admitting: Internal Medicine

## 2023-01-26 ENCOUNTER — Ambulatory Visit
Admission: RE | Admit: 2023-01-26 | Discharge: 2023-01-26 | Disposition: A | Discharge status: Home / Self Care | Payer: 59 | Source: Ambulatory Visit | Attending: Internal Medicine | Admitting: Internal Medicine

## 2023-01-26 ENCOUNTER — Ambulatory Visit
Admission: RE | Admit: 2023-01-26 | Discharge: 2023-01-26 | Disposition: A | Discharge status: Home / Self Care | Payer: 59 | Attending: Internal Medicine | Admitting: Internal Medicine

## 2023-01-26 VITALS — BP 130/70 | HR 75 | Ht 59.0 in | Wt 128.0 lb

## 2023-01-26 DIAGNOSIS — R1012 Left upper quadrant pain: Secondary | ICD-10-CM

## 2023-01-26 DIAGNOSIS — R5383 Other fatigue: Secondary | ICD-10-CM | POA: Diagnosis not present

## 2023-01-26 DIAGNOSIS — E782 Mixed hyperlipidemia: Secondary | ICD-10-CM

## 2023-01-26 DIAGNOSIS — F411 Generalized anxiety disorder: Secondary | ICD-10-CM

## 2023-01-26 DIAGNOSIS — I10 Essential (primary) hypertension: Secondary | ICD-10-CM | POA: Diagnosis not present

## 2023-01-26 DIAGNOSIS — R109 Unspecified abdominal pain: Secondary | ICD-10-CM | POA: Diagnosis not present

## 2023-01-26 DIAGNOSIS — K5904 Chronic idiopathic constipation: Secondary | ICD-10-CM | POA: Insufficient documentation

## 2023-01-26 DIAGNOSIS — K5900 Constipation, unspecified: Secondary | ICD-10-CM | POA: Diagnosis not present

## 2023-01-26 MED ORDER — BUSPIRONE HCL 15 MG PO TABS
15.0000 mg | ORAL_TABLET | Freq: Two times a day (BID) | ORAL | 3 refills | Status: DC
Start: 2023-01-26 — End: 2023-06-18

## 2023-01-26 NOTE — Progress Notes (Signed)
Established Patient Office Visit  Subjective:  Patient ID: Allison Harvey, female    DOB: [birth year]  Age: 72 y.o. MRN: [id number]  Chief Complaint  Patient presents with   Follow-up    4 month follow up    Patient comes in for her follow-up today.  She has a few concerns.  First of all she is complaining of some anxiety which she is getting on her BuSpar.  She has 10 mg tablets at home and was taking half a tablet twice a day.  But today she reports she is taking 10 mg a whole tablet but still gets breakthrough anxiety.  Agrees to increase the dose to 50 mg twice a day.  Previously she was also on Prozac but wants to wait. She complains of some left upper quadrant discomfort.  No nausea vomiting.  No abdominal cramps.  No diarrhea or blood in stools.  Her appetite is good.  However she does have history of constipation.  And currently thinks that she hurt her constipation has gotten a little worse although she might have some loose bowel movements at time. Will check an abdominal expert x-ray, suspect stool fecal impaction.  If so then will start MiraLAX laxative packets at night, may need a Dulcolax suppository or fleets enema as needed.  Her most recent colonoscopy was done in 2021 which showed mild diverticulosis and nonbleeding internal hemorrhoids. She had a CT abdomen in August 2023 for similar complaints and it was essentially unremarkable. Patient also needs some refills.  Patient was seen by ENT for hearing test ,they further cleaned her ears and she thinks her hearing has improved.  Hearing aids were too expensive at this time.    No other concerns at this time.   Past Medical History:  Diagnosis Date   Anxiety    Cataract    Depression    GERD (gastroesophageal reflux disease)    H/O seasonal allergies    Hyperlipidemia    Hypertension     Past Surgical History:  Procedure Laterality Date   BREAST BIOPSY Right 07/13/2015   benign   COLONOSCOPY WITH PROPOFOL N/A  09/20/2015   Procedure: COLONOSCOPY WITH PROPOFOL;  Surgeon: Christena Deem, MD;  Location: [facility] ENDOSCOPY;  Service: Endoscopy;  Laterality: N/A;   LIPOMA EXCISION Right 03/29/2018   Procedure: EXCISION LIPOMA- RIGHT FOREARM;  Surgeon: Griselda Miner, MD;  Location: [facility] ORS;  Service: General;  Laterality: Right;   TUBAL LIGATION      Social History   Socioeconomic History   Marital status: Widowed    Spouse name: Not on file   Number of children: Not on file   Years of education: Not on file   Highest education level: Not on file  Occupational History   Not on file  Tobacco Use   Smoking status: Former    Types: Cigarettes    Quit date: 09/19/1994    Years since quitting: 28.3   Smokeless tobacco: Never  Vaping Use   Vaping Use: Never used  Substance and Sexual Activity   Alcohol use: No   Drug use: No   Sexual activity: Not Currently    Birth control/protection: Post-menopausal  Other Topics Concern   Not on file  Social History Narrative   Not on file   Social Determinants of Health   Financial Resource Strain: Not on file  Food Insecurity: Not on file  Transportation Needs: Not on file  Physical Activity: Not on file  Stress: Not  on file  Social Connections: Not on file  Intimate Partner Violence: Not on file    Family History  Problem Relation Age of Onset   Stroke Mother        or MI   Heart disease Mother    Alcohol abuse Father    Alcohol abuse Sister    Hypertension Son    Breast cancer Neg Hx     No Known Allergies  Review of Systems  Constitutional:  Positive for malaise/fatigue. Negative for chills, fever and weight loss.  HENT:  Positive for hearing loss. Negative for ear pain, sinus pain and tinnitus.   Eyes: Negative.   Respiratory:  Negative for cough, shortness of breath, wheezing and stridor.   Cardiovascular:  Negative for chest pain, palpitations, leg swelling and PND.  Gastrointestinal:  Positive for constipation. Negative for  abdominal pain, blood in stool, diarrhea, heartburn, melena, nausea and vomiting.  Genitourinary:  Negative for dysuria, flank pain, frequency and hematuria.  Musculoskeletal:  Negative for back pain, joint pain, myalgias and neck pain.  Skin:  Negative for itching and rash.  Neurological: Negative.   Psychiatric/Behavioral:  Negative for depression, hallucinations, memory loss and suicidal ideas. The patient is nervous/anxious. The patient does not have insomnia.        Objective:   BP 130/70   Pulse 75   Ht 4\' 11"  (1.499 m)   Wt 128 lb (58.1 kg)   SpO2 96%   BMI 25.85 kg/m   Vitals:   01/26/23 0948  BP: 130/70  Pulse: 75  Height: 4\' 11"  (1.499 m)  Weight: 128 lb (58.1 kg)  SpO2: 96%  BMI (Calculated): 25.84    Physical Exam Vitals and nursing note reviewed.  Constitutional:      Appearance: Normal appearance.  Cardiovascular:     Rate and Rhythm: Regular rhythm. Tachycardia present.     Pulses: Normal pulses.     Heart sounds: Normal heart sounds. No murmur heard. Pulmonary:     Effort: Pulmonary effort is normal.     Breath sounds: Normal breath sounds. No wheezing, rhonchi or rales.  Abdominal:     General: Bowel sounds are normal.     Palpations: Abdomen is soft.  Musculoskeletal:        General: No swelling. Normal range of motion.     Cervical back: Normal range of motion and neck supple.     Right lower leg: No edema.     Left lower leg: No edema.  Skin:    General: Skin is warm and dry.  Neurological:     General: No focal deficit present.     Mental Status: She is alert and oriented to person, place, and time.  Psychiatric:        Mood and Affect: Mood normal.        Behavior: Behavior normal.      No results found for any visits on 01/26/23.  No results found for this or any previous visit (from the past 2160 hour(s)).  ASSESSMENT and PLAN   Check blood work today.  X-ray KUB abdomen shows heavy stool burden, no obstruction.  Start MiraLAX  daily 1 packet.  May need Dulcolax suppositories.  Increase dose of BuSpar to 15 mg twice a day.  May resume Prozac follow-up.  Problem List Items Addressed This Visit     Essential hypertension, benign   Relevant Orders   CMP14+EGFR   Hyperlipidemia   Relevant Orders   Lipid Profile  Left upper quadrant abdominal pain - Primary   Relevant Orders   CBC With Differential   DG Abd 1 View (Completed)   Other fatigue   Relevant Orders   TSH   GAD (generalized anxiety disorder)   Relevant Medications   busPIRone (BUSPAR) 15 MG tablet   Chronic idiopathic constipation    Return in about 10 days (around 02/05/2023).   Total time spent: 30 minutes  Margaretann Loveless, MD  01/26/2023   This document may have been prepared by [facility] Voice Recognition software and as such may include unintentional dictation errors.

## 2023-01-27 LAB — CBC WITH DIFFERENTIAL
Basophils Absolute: 0 10*3/uL (ref 0.0–0.2)
Basos: 0 %
EOS (ABSOLUTE): 0.2 10*3/uL (ref 0.0–0.4)
Eos: 2 %
Hematocrit: 39.7 % (ref 34.0–46.6)
Hemoglobin: 12.9 g/dL (ref 11.1–15.9)
Immature Grans (Abs): 0 10*3/uL (ref 0.0–0.1)
Immature Granulocytes: 0 %
Lymphocytes Absolute: 1.4 10*3/uL (ref 0.7–3.1)
Lymphs: 15 %
MCH: 29.9 pg (ref 26.6–33.0)
MCHC: 32.5 g/dL (ref 31.5–35.7)
MCV: 92 fL (ref 79–97)
Monocytes Absolute: 0.5 10*3/uL (ref 0.1–0.9)
Monocytes: 6 %
Neutrophils Absolute: 7.2 10*3/uL — ABNORMAL HIGH (ref 1.4–7.0)
Neutrophils: 77 %
RBC: 4.31 x10E6/uL (ref 3.77–5.28)
RDW: 12.4 % (ref 11.7–15.4)
WBC: 9.4 10*3/uL (ref 3.4–10.8)

## 2023-01-27 LAB — LIPID PANEL
Chol/HDL Ratio: 2.8 ratio (ref 0.0–4.4)
Cholesterol, Total: 151 mg/dL (ref 100–199)
HDL: 53 mg/dL (ref >39)
LDL Chol Calc (NIH): 82 mg/dL (ref 0–99)
Triglycerides: 86 mg/dL (ref 0–149)
VLDL Cholesterol Cal: 16 mg/dL (ref 5–40)

## 2023-01-27 LAB — CMP14+EGFR
ALT: 14 IU/L (ref 0–32)
AST: 15 IU/L (ref 0–40)
Albumin/Globulin Ratio: 1.9 (ref 1.2–2.2)
Albumin: 4.2 g/dL (ref 3.8–4.8)
Alkaline Phosphatase: 91 IU/L (ref 44–121)
BUN/Creatinine Ratio: 30 — ABNORMAL HIGH (ref 12–28)
BUN: 21 mg/dL (ref 8–27)
Bilirubin Total: 0.3 mg/dL (ref 0.0–1.2)
CO2: 24 mmol/L (ref 20–29)
Calcium: 9.6 mg/dL (ref 8.7–10.3)
Chloride: 101 mmol/L (ref 96–106)
Creatinine, Ser: 0.71 mg/dL (ref 0.57–1.00)
Globulin, Total: 2.2 g/dL (ref 1.5–4.5)
Glucose: 94 mg/dL (ref 70–99)
Potassium: 4.5 mmol/L (ref 3.5–5.2)
Sodium: 139 mmol/L (ref 134–144)
Total Protein: 6.4 g/dL (ref 6.0–8.5)
eGFR: 90 mL/min/{1.73_m2} (ref >59)

## 2023-01-27 LAB — TSH: TSH: 0.691 u[IU]/mL (ref 0.450–4.500)

## 2023-02-05 ENCOUNTER — Ambulatory Visit (INDEPENDENT_AMBULATORY_CARE_PROVIDER_SITE_OTHER): Payer: 59 | Admitting: Internal Medicine

## 2023-02-05 ENCOUNTER — Encounter: Payer: Self-pay | Admitting: Internal Medicine

## 2023-02-05 VITALS — BP 118/68 | HR 82 | Ht 59.0 in | Wt 129.0 lb

## 2023-02-05 DIAGNOSIS — I10 Essential (primary) hypertension: Secondary | ICD-10-CM

## 2023-02-05 DIAGNOSIS — F411 Generalized anxiety disorder: Secondary | ICD-10-CM

## 2023-02-05 DIAGNOSIS — E782 Mixed hyperlipidemia: Secondary | ICD-10-CM

## 2023-02-05 DIAGNOSIS — K5904 Chronic idiopathic constipation: Secondary | ICD-10-CM | POA: Diagnosis not present

## 2023-02-05 MED ORDER — POLYETHYLENE GLYCOL 3350 17 G PO PACK: 17.0000 g | PACK | Freq: Every day | ORAL | 3 refills | Status: DC

## 2023-02-05 NOTE — Progress Notes (Signed)
Established Patient Office Visit  Subjective:  Patient ID: Allison Harvey, female    DOB: [birth year]  Age: 72 y.o. MRN: [id number]  Chief Complaint  Patient presents with   Follow-up    10 day follow up    Patient comes in for her follow-up today.  Her left upper quadrant pain has eased up since she took suppositories and had a good bowel movement.  Her abdominal x-ray had showed excessive stool burden.  Since then patient has also taking MiraLAX but not regularly.  However she is having a bowel movement every day.  Patient advised to continue taking MiraLAX regularly and a suppository as needed. Patient is also taking the increased dose of BuSpar and it seems to be working.  She does not have any more anxiety.  She wants to stay off her Prozac for now. And her blood pressure looks good today as well. Patient had blood work done, results discussed, essentially unremarkable.    No other concerns at this time.   Past Medical History:  Diagnosis Date   Anxiety    Cataract    Depression    GERD (gastroesophageal reflux disease)    H/O seasonal allergies    Hyperlipidemia    Hypertension     Past Surgical History:  Procedure Laterality Date   BREAST BIOPSY Right 07/13/2015   benign   COLONOSCOPY WITH PROPOFOL N/A 09/20/2015   Procedure: COLONOSCOPY WITH PROPOFOL;  Surgeon: Christena Deem, MD;  Location: [facility] ENDOSCOPY;  Service: Endoscopy;  Laterality: N/A;   LIPOMA EXCISION Right 03/29/2018   Procedure: EXCISION LIPOMA- RIGHT FOREARM;  Surgeon: Griselda Miner, MD;  Location: [facility] ORS;  Service: General;  Laterality: Right;   TUBAL LIGATION      Social History   Socioeconomic History   Marital status: Widowed    Spouse name: Not on file   Number of children: Not on file   Years of education: Not on file   Highest education level: Not on file  Occupational History   Not on file  Tobacco Use   Smoking status: Former    Types: Cigarettes    Quit date: 09/19/1994     Years since quitting: 28.4   Smokeless tobacco: Never  Vaping Use   Vaping Use: Never used  Substance and Sexual Activity   Alcohol use: No   Drug use: No   Sexual activity: Not Currently    Birth control/protection: Post-menopausal  Other Topics Concern   Not on file  Social History Narrative   Not on file   Social Determinants of Health   Financial Resource Strain: Not on file  Food Insecurity: Not on file  Transportation Needs: Not on file  Physical Activity: Not on file  Stress: Not on file  Social Connections: Not on file  Intimate Partner Violence: Not on file    Family History  Problem Relation Age of Onset   Stroke Mother        or MI   Heart disease Mother    Alcohol abuse Father    Alcohol abuse Sister    Hypertension Son    Breast cancer Neg Hx     No Known Allergies  Review of Systems  Constitutional:  Negative for chills, diaphoresis, fever, malaise/fatigue and weight loss.  HENT:  Positive for hearing loss. Negative for congestion, ear discharge, sinus pain, sore throat and tinnitus.   Eyes: Negative.   Respiratory:  Negative for cough, hemoptysis, sputum production, shortness of breath, wheezing  and stridor.   Cardiovascular:  Negative for chest pain, palpitations, orthopnea, claudication, leg swelling and PND.  Gastrointestinal:  Positive for constipation. Negative for abdominal pain, blood in stool, diarrhea, heartburn, melena, nausea and vomiting.  Genitourinary: Negative.   Musculoskeletal:  Negative for back pain, joint pain and myalgias.  Skin: Negative.   Neurological:  Negative for dizziness, tremors, sensory change, focal weakness, seizures, loss of consciousness, weakness and headaches.  Psychiatric/Behavioral:  Negative for depression. The patient is not nervous/anxious and does not have insomnia.        Objective:   BP 118/68   Pulse 82   Ht 4\' 11"  (1.499 m)   Wt 129 lb (58.5 kg)   SpO2 97%   BMI 26.05 kg/m   Vitals:    02/05/23 0935  BP: 118/68  Pulse: 82  Height: 4\' 11"  (1.499 m)  Weight: 129 lb (58.5 kg)  SpO2: 97%  BMI (Calculated): 26.04    Physical Exam Vitals and nursing note reviewed.  Constitutional:      Appearance: Normal appearance.  Cardiovascular:     Rate and Rhythm: Normal rate and regular rhythm.     Pulses: Normal pulses.     Heart sounds: Normal heart sounds.  Pulmonary:     Effort: Pulmonary effort is normal.     Breath sounds: Normal breath sounds. No wheezing, rhonchi or rales.  Abdominal:     General: Bowel sounds are normal.     Palpations: Abdomen is soft. There is no mass.     Tenderness: There is no abdominal tenderness. There is no right CVA tenderness, left CVA tenderness or guarding.  Musculoskeletal:        General: No swelling, tenderness, deformity or signs of injury. Normal range of motion.     Cervical back: Normal range of motion and neck supple.     Right lower leg: No edema.     Left lower leg: No edema.  Skin:    Findings: No bruising, erythema, lesion or rash.  Neurological:     General: No focal deficit present.     Mental Status: She is alert and oriented to person, place, and time.  Psychiatric:        Mood and Affect: Mood normal.        Behavior: Behavior normal.      No results found for any visits on 02/05/23.  Recent Results (from the past 2160 hour(s))  TSH     Status: None   Collection Time: 01/26/23 10:34 AM  Result Value Ref Range   TSH 0.691 0.450 - 4.500 uIU/mL  CMP14+EGFR     Status: Abnormal   Collection Time: 01/26/23 10:34 AM  Result Value Ref Range   Glucose 94 70 - 99 mg/dL   BUN 21 8 - 27 mg/dL   Creatinine, Ser 1.61 0.57 - 1.00 mg/dL   eGFR 90 >09 UE/AVW/0.98   BUN/Creatinine Ratio 30 (H) 12 - 28   Sodium 139 134 - 144 mmol/L   Potassium 4.5 3.5 - 5.2 mmol/L   Chloride 101 96 - 106 mmol/L   CO2 24 20 - 29 mmol/L   Calcium 9.6 8.7 - 10.3 mg/dL   Total Protein 6.4 6.0 - 8.5 g/dL   Albumin 4.2 3.8 - 4.8 g/dL    Globulin, Total 2.2 1.5 - 4.5 g/dL   Albumin/Globulin Ratio 1.9 1.2 - 2.2   Bilirubin Total 0.3 0.0 - 1.2 mg/dL   Alkaline Phosphatase 91 44 - 121 IU/L  AST 15 0 - 40 IU/L   ALT 14 0 - 32 IU/L  Lipid Profile     Status: None   Collection Time: 01/26/23 10:34 AM  Result Value Ref Range   Cholesterol, Total 151 100 - 199 mg/dL   Triglycerides 86 0 - 149 mg/dL   HDL 53 >16 mg/dL   VLDL Cholesterol Cal 16 5 - 40 mg/dL   LDL Chol Calc (NIH) 82 0 - 99 mg/dL   Chol/HDL Ratio 2.8 0.0 - 4.4 ratio    Comment:                                   T. Chol/HDL Ratio                                             Men  Women                               1/2 Avg.Risk  3.4    3.3                                   Avg.Risk  5.0    4.4                                2X Avg.Risk  9.6    7.1                                3X Avg.Risk 23.4   11.0   CBC With Differential     Status: Abnormal   Collection Time: 01/26/23 10:34 AM  Result Value Ref Range   WBC 9.4 3.4 - 10.8 x10E3/uL   RBC 4.31 3.77 - 5.28 x10E6/uL   Hemoglobin 12.9 11.1 - 15.9 g/dL   Hematocrit 10.9 60.4 - 46.6 %   MCV 92 79 - 97 fL   MCH 29.9 26.6 - 33.0 pg   MCHC 32.5 31.5 - 35.7 g/dL   RDW 54.0 98.1 - 19.1 %   Neutrophils 77 Not Estab. %   Lymphs 15 Not Estab. %   Monocytes 6 Not Estab. %   Eos 2 Not Estab. %   Basos 0 Not Estab. %   Neutrophils Absolute 7.2 (H) 1.4 - 7.0 x10E3/uL   Lymphocytes Absolute 1.4 0.7 - 3.1 x10E3/uL   Monocytes Absolute 0.5 0.1 - 0.9 x10E3/uL   EOS (ABSOLUTE) 0.2 0.0 - 0.4 x10E3/uL   Basophils Absolute 0.0 0.0 - 0.2 x10E3/uL   Immature Granulocytes 0 Not Estab. %   Immature Grans (Abs) 0.0 0.0 - 0.1 x10E3/uL      Assessment & Plan:  Patient advised to continue taking all her medications as such.  Prescription for MiraLAX sent to the pharmacy. Patient will let us know if her abdominal pain returns. Problem List Items Addressed This Visit     Essential hypertension, benign - Primary    Hyperlipidemia   GAD (generalized anxiety disorder)   Chronic idiopathic constipation   Relevant Medications   polyethylene glycol (MIRALAX) 17 g packet    Return  in about 2 months (around 04/07/2023).   Total time spent: 30 minutes  Margaretann Loveless, MD  02/05/2023   This document may have been prepared by [facility] Voice Recognition software and as such may include unintentional dictation errors.

## 2023-03-13 ENCOUNTER — Telehealth: Payer: Self-pay | Admitting: Internal Medicine

## 2023-03-13 ENCOUNTER — Other Ambulatory Visit: Payer: Self-pay | Admitting: Internal Medicine

## 2023-03-13 DIAGNOSIS — M25511 Pain in right shoulder: Secondary | ICD-10-CM

## 2023-03-13 MED ORDER — MELOXICAM 7.5 MG PO TABS: 7.5000 mg | ORAL_TABLET | Freq: Two times a day (BID) | ORAL | 6 refills | Status: DC

## 2023-03-13 NOTE — Telephone Encounter (Signed)
Patient left VM wanting new Rx for meloxicam sent to pharmacy. She is requesting the Rx be written for her to take 2 tablets daily. Please advise.

## 2023-04-06 ENCOUNTER — Ambulatory Visit: Payer: 59 | Admitting: Internal Medicine

## 2023-04-13 ENCOUNTER — Ambulatory Visit (INDEPENDENT_AMBULATORY_CARE_PROVIDER_SITE_OTHER): Payer: 59 | Admitting: Internal Medicine

## 2023-04-13 ENCOUNTER — Encounter: Payer: Self-pay | Admitting: Internal Medicine

## 2023-04-13 VITALS — BP 130/84 | HR 68 | Ht 59.0 in | Wt 127.4 lb

## 2023-04-13 DIAGNOSIS — I10 Essential (primary) hypertension: Secondary | ICD-10-CM | POA: Diagnosis not present

## 2023-04-13 DIAGNOSIS — K5904 Chronic idiopathic constipation: Secondary | ICD-10-CM

## 2023-04-13 DIAGNOSIS — E782 Mixed hyperlipidemia: Secondary | ICD-10-CM

## 2023-04-13 DIAGNOSIS — F411 Generalized anxiety disorder: Secondary | ICD-10-CM | POA: Diagnosis not present

## 2023-04-13 MED ORDER — FLUOXETINE HCL 10 MG PO CAPS
10.0000 mg | ORAL_CAPSULE | Freq: Every day | ORAL | 2 refills | Status: DC
Start: 2023-04-13 — End: 2023-07-10

## 2023-04-13 NOTE — Progress Notes (Signed)
Established Patient Office Visit  Subjective:  Patient ID: Allison Harvey, female    DOB: [birth year]  Age: 72 y.o. MRN: [id number]  Chief Complaint  Patient presents with   Follow-up    2 month follow up    Patient comes in for follow-up accompanied by her husband.  She is feeling well and constipation is no longer a problem.  No further nausea vomiting or abdominal pain.  She goes to bathroom regularly with the help of MiraLAX. However she mentions that she would need to resume her Prozac for her anxiety disorder.  Currently she is on BuSpar twice a day.  She was previously on Prozac but had stopped it herself.  Wants to resume at a very small dose.  Will send in prescription for 10 mg/day.     No other concerns at this time.   Past Medical History:  Diagnosis Date   Anxiety    Cataract    Depression    GERD (gastroesophageal reflux disease)    H/O seasonal allergies    Hyperlipidemia    Hypertension     Past Surgical History:  Procedure Laterality Date   BREAST BIOPSY Right 07/13/2015   benign   COLONOSCOPY WITH PROPOFOL N/A 09/20/2015   Procedure: COLONOSCOPY WITH PROPOFOL;  Surgeon: Christena Deem, MD;  Location: [facility] ENDOSCOPY;  Service: Endoscopy;  Laterality: N/A;   LIPOMA EXCISION Right 03/29/2018   Procedure: EXCISION LIPOMA- RIGHT FOREARM;  Surgeon: Griselda Miner, MD;  Location: [facility] ORS;  Service: General;  Laterality: Right;   TUBAL LIGATION      Social History   Socioeconomic History   Marital status: Widowed    Spouse name: Not on file   Number of children: Not on file   Years of education: Not on file   Highest education level: Not on file  Occupational History   Not on file  Tobacco Use   Smoking status: Former    Current packs/day: 0.00    Types: Cigarettes    Quit date: 09/19/1994    Years since quitting: 28.5   Smokeless tobacco: Never  Vaping Use   Vaping status: Never Used  Substance and Sexual Activity   Alcohol use: No   Drug  use: No   Sexual activity: Not Currently    Birth control/protection: Post-menopausal  Other Topics Concern   Not on file  Social History Narrative   Not on file   Social Determinants of Health   Financial Resource Strain: Not on file  Food Insecurity: Not on file  Transportation Needs: Not on file  Physical Activity: Not on file  Stress: Not on file  Social Connections: Not on file  Intimate Partner Violence: Not on file    Family History  Problem Relation Age of Onset   Stroke Mother        or MI   Heart disease Mother    Alcohol abuse Father    Alcohol abuse Sister    Hypertension Son    Breast cancer Neg Hx     No Known Allergies  Review of Systems  Constitutional: Negative.  Negative for chills, fever, malaise/fatigue and weight loss.  HENT: Negative.  Negative for hearing loss, nosebleeds, sinus pain and sore throat.   Eyes: Negative.   Respiratory: Negative.  Negative for cough and shortness of breath.   Cardiovascular: Negative.  Negative for chest pain, palpitations and leg swelling.  Gastrointestinal: Negative.  Negative for abdominal pain, constipation, diarrhea, heartburn, nausea and vomiting.  Genitourinary: Negative.  Negative for dysuria and flank pain.  Musculoskeletal: Negative.  Negative for joint pain and myalgias.  Skin: Negative.   Neurological: Negative.  Negative for dizziness and headaches.  Endo/Heme/Allergies: Negative.   Psychiatric/Behavioral:  Negative for depression and suicidal ideas. The patient is nervous/anxious.        Objective:   BP 130/84   Pulse 68   Ht 4\' 11"  (1.499 m)   Wt 127 lb 6.4 oz (57.8 kg)   SpO2 97%   BMI 25.73 kg/m   Vitals:   04/13/23 1127  BP: 130/84  Pulse: 68  Height: 4\' 11"  (1.499 m)  Weight: 127 lb 6.4 oz (57.8 kg)  SpO2: 97%  BMI (Calculated): 25.72    Physical Exam Vitals and nursing note reviewed.  Constitutional:      Appearance: Normal appearance.  HENT:     Head: Normocephalic and  atraumatic.     Nose: Nose normal.     Mouth/Throat:     Mouth: Mucous membranes are moist.     Pharynx: Oropharynx is clear.  Eyes:     Conjunctiva/sclera: Conjunctivae normal.     Pupils: Pupils are equal, round, and reactive to light.  Cardiovascular:     Rate and Rhythm: Normal rate and regular rhythm.     Pulses: Normal pulses.     Heart sounds: Normal heart sounds. No murmur heard. Pulmonary:     Effort: Pulmonary effort is normal.     Breath sounds: Normal breath sounds. No wheezing.  Abdominal:     General: Bowel sounds are normal.     Palpations: Abdomen is soft.     Tenderness: There is no abdominal tenderness. There is no right CVA tenderness or left CVA tenderness.  Musculoskeletal:        General: Normal range of motion.     Cervical back: Normal range of motion.     Right lower leg: No edema.     Left lower leg: No edema.  Skin:    General: Skin is warm and dry.  Neurological:     General: No focal deficit present.     Mental Status: She is alert and oriented to person, place, and time.  Psychiatric:        Mood and Affect: Mood normal.        Behavior: Behavior normal.      No results found for any visits on 04/13/23.     Assessment & Plan:  Continue all medications.  Prozac added. Will adjust further at next follow-up. Problem List Items Addressed This Visit     Essential hypertension, benign - Primary   Hyperlipidemia   GAD (generalized anxiety disorder)   Relevant Medications   FLUoxetine (PROZAC) 10 MG capsule   Chronic idiopathic constipation    Return in about 6 weeks (around 05/25/2023).   Total time spent: 30 minutes  Margaretann Loveless, MD  04/13/2023   This document may have been prepared by [facility] Voice Recognition software and as such may include unintentional dictation errors.

## 2023-05-23 DIAGNOSIS — H6123 Impacted cerumen, bilateral: Secondary | ICD-10-CM | POA: Diagnosis not present

## 2023-05-23 DIAGNOSIS — H902 Conductive hearing loss, unspecified: Secondary | ICD-10-CM | POA: Diagnosis not present

## 2023-05-25 ENCOUNTER — Encounter: Payer: Self-pay | Admitting: Internal Medicine

## 2023-05-25 ENCOUNTER — Ambulatory Visit (INDEPENDENT_AMBULATORY_CARE_PROVIDER_SITE_OTHER): Payer: 59 | Admitting: Internal Medicine

## 2023-05-25 VITALS — BP 122/79 | HR 69 | Ht 59.0 in | Wt 125.6 lb

## 2023-05-25 DIAGNOSIS — I10 Essential (primary) hypertension: Secondary | ICD-10-CM | POA: Diagnosis not present

## 2023-05-25 DIAGNOSIS — E782 Mixed hyperlipidemia: Secondary | ICD-10-CM | POA: Diagnosis not present

## 2023-05-25 DIAGNOSIS — F411 Generalized anxiety disorder: Secondary | ICD-10-CM

## 2023-05-25 NOTE — Progress Notes (Signed)
Established Patient Office Visit  Subjective:  Patient ID: Allison Harvey, female    DOB: 1951-03-21  Age: 72 y.o. MRN: [id number]  Chief Complaint  Patient presents with   Follow-up    Patient comes in for a follow-up accompanied by her husband.  She is taking her Prozac regularly and feels much happier now.  She thinks her anxiety is under good control so she cut back the BuSpar to half a tablet twice a day. Generally feels very well.  Fasting labs today.    No other concerns at this time.   Past Medical History:  Diagnosis Date   Anxiety    Cataract    Depression    GERD (gastroesophageal reflux disease)    H/O seasonal allergies    Hyperlipidemia    Hypertension     Past Surgical History:  Procedure Laterality Date   BREAST BIOPSY Right 07/13/2015   benign   COLONOSCOPY WITH PROPOFOL N/A 09/20/2015   Procedure: COLONOSCOPY WITH PROPOFOL;  Surgeon: Christena Deem, MD;  Location: [facility] ENDOSCOPY;  Service: Endoscopy;  Laterality: N/A;   LIPOMA EXCISION Right 03/29/2018   Procedure: EXCISION LIPOMA- RIGHT FOREARM;  Surgeon: Griselda Miner, MD;  Location: [facility] ORS;  Service: General;  Laterality: Right;   TUBAL LIGATION      Social History   Socioeconomic History   Marital status: Widowed    Spouse name: Not on file   Number of children: Not on file   Years of education: Not on file   Highest education level: Not on file  Occupational History   Not on file  Tobacco Use   Smoking status: Former    Current packs/day: 0.00    Types: Cigarettes    Quit date: 09/19/1994    Years since quitting: 28.6   Smokeless tobacco: Never  Vaping Use   Vaping status: Never Used  Substance and Sexual Activity   Alcohol use: No   Drug use: No   Sexual activity: Not Currently    Birth control/protection: Post-menopausal  Other Topics Concern   Not on file  Social History Narrative   Not on file   Social Determinants of Health   Financial Resource Strain: Not on file   Food Insecurity: Not on file  Transportation Needs: Not on file  Physical Activity: Not on file  Stress: Not on file  Social Connections: Not on file  Intimate Partner Violence: Not on file    Family History  Problem Relation Age of Onset   Stroke Mother        or MI   Heart disease Mother    Alcohol abuse Father    Alcohol abuse Sister    Hypertension Son    Breast cancer Neg Hx     No Known Allergies  Review of Systems  Constitutional: Negative.  Negative for chills, fever, malaise/fatigue and weight loss.  HENT: Negative.    Eyes: Negative.   Respiratory: Negative.  Negative for cough and shortness of breath.   Cardiovascular: Negative.  Negative for chest pain, palpitations and leg swelling.  Gastrointestinal: Negative.  Negative for abdominal pain, constipation, diarrhea, heartburn, nausea and vomiting.  Genitourinary: Negative.  Negative for dysuria and flank pain.  Musculoskeletal: Negative.  Negative for joint pain and myalgias.  Skin: Negative.   Neurological: Negative.  Negative for dizziness and headaches.  Endo/Heme/Allergies: Negative.   Psychiatric/Behavioral: Negative.  Negative for depression and suicidal ideas. The patient is not nervous/anxious.  Objective:   BP 122/79   Pulse 69   Ht 4\' 11"  (1.499 m)   Wt 125 lb 9.6 oz (57 kg)   SpO2 96%   BMI 25.37 kg/m   Vitals:   05/25/23 0915  BP: 122/79  Pulse: 69  Height: 4\' 11"  (1.499 m)  Weight: 125 lb 9.6 oz (57 kg)  SpO2: 96%  BMI (Calculated): 25.35    Physical Exam Vitals and nursing note reviewed.  Constitutional:      Appearance: Normal appearance.  HENT:     Head: Normocephalic and atraumatic.     Nose: Nose normal.     Mouth/Throat:     Mouth: Mucous membranes are moist.     Pharynx: Oropharynx is clear.  Eyes:     Conjunctiva/sclera: Conjunctivae normal.     Pupils: Pupils are equal, round, and reactive to light.  Cardiovascular:     Rate and Rhythm: Normal rate and  regular rhythm.     Pulses: Normal pulses.     Heart sounds: Normal heart sounds. No murmur heard. Pulmonary:     Effort: Pulmonary effort is normal.     Breath sounds: Normal breath sounds. No wheezing.  Abdominal:     General: Bowel sounds are normal.     Palpations: Abdomen is soft.     Tenderness: There is no abdominal tenderness. There is no right CVA tenderness or left CVA tenderness.  Musculoskeletal:        General: Normal range of motion.     Cervical back: Normal range of motion.     Right lower leg: No edema.     Left lower leg: No edema.  Skin:    General: Skin is warm and dry.  Neurological:     General: No focal deficit present.     Mental Status: She is alert and oriented to person, place, and time.  Psychiatric:        Mood and Affect: Mood normal.        Behavior: Behavior normal.      No results found for any visits on 05/25/23.  No results found for this or any previous visit (from the past 2160 hour(s)).    Assessment & Plan:  Continue current medication.  Check labs. Problem List Items Addressed This Visit     Essential hypertension, benign - Primary   Relevant Orders   CMP14+EGFR   Hyperlipidemia   Relevant Orders   Lipid Panel w/o Chol/HDL Ratio   GAD (generalized anxiety disorder)    Return in about 3 months (around 08/24/2023).   Total time spent:  Margaretann Loveless, MD  05/25/2023   This document may have been prepared by [facility] Voice Recognition software and as such may include unintentional dictation errors.

## 2023-05-26 LAB — LIPID PANEL W/O CHOL/HDL RATIO
Cholesterol, Total: 145 mg/dL (ref 100–199)
HDL: 57 mg/dL (ref >39)
LDL Chol Calc (NIH): 75 mg/dL (ref 0–99)
Triglycerides: 66 mg/dL (ref 0–149)
VLDL Cholesterol Cal: 13 mg/dL (ref 5–40)

## 2023-05-26 LAB — CMP14+EGFR
ALT: 14 IU/L (ref 0–32)
AST: 16 IU/L (ref 0–40)
Albumin: 4.2 g/dL (ref 3.8–4.8)
Alkaline Phosphatase: 83 IU/L (ref 44–121)
BUN/Creatinine Ratio: 29 — ABNORMAL HIGH (ref 12–28)
BUN: 23 mg/dL (ref 8–27)
Bilirubin Total: 0.3 mg/dL (ref 0.0–1.2)
CO2: 26 mmol/L (ref 20–29)
Calcium: 9.9 mg/dL (ref 8.7–10.3)
Chloride: 103 mmol/L (ref 96–106)
Creatinine, Ser: 0.79 mg/dL (ref 0.57–1.00)
Globulin, Total: 1.8 g/dL (ref 1.5–4.5)
Glucose: 85 mg/dL (ref 70–99)
Potassium: 5 mmol/L (ref 3.5–5.2)
Sodium: 142 mmol/L (ref 134–144)
Total Protein: 6 g/dL (ref 6.0–8.5)
eGFR: 79 mL/min/{1.73_m2} (ref >59)

## 2023-05-28 NOTE — Progress Notes (Signed)
Patient notified

## 2023-06-07 ENCOUNTER — Encounter: Payer: Self-pay | Admitting: Internal Medicine

## 2023-06-07 ENCOUNTER — Ambulatory Visit: Payer: 59 | Admitting: Internal Medicine

## 2023-06-07 VITALS — BP 142/88 | HR 70 | Ht 59.0 in | Wt 125.8 lb

## 2023-06-07 DIAGNOSIS — F411 Generalized anxiety disorder: Secondary | ICD-10-CM | POA: Diagnosis not present

## 2023-06-07 DIAGNOSIS — I10 Essential (primary) hypertension: Secondary | ICD-10-CM | POA: Diagnosis not present

## 2023-06-07 DIAGNOSIS — M7062 Trochanteric bursitis, left hip: Secondary | ICD-10-CM | POA: Diagnosis not present

## 2023-06-07 DIAGNOSIS — E782 Mixed hyperlipidemia: Secondary | ICD-10-CM

## 2023-06-07 MED ORDER — METHYLPREDNISOLONE 4 MG PO TBPK
ORAL_TABLET | ORAL | 0 refills | Status: DC
Start: 2023-06-07 — End: 2023-08-09

## 2023-06-07 NOTE — Progress Notes (Signed)
Established Patient Office Visit  Subjective:  Patient ID: Allison Harvey, female    DOB: 12-03-1950  Age: 72 y.o. MRN: [id number]  Chief Complaint  Patient presents with   Leg Pain    Right leg pain    Comes in with few days history of right lower extremity pain.  Usually starts from the right hip and then goes down the front of the knee and the side of the lower leg.  Happens when she gets up and puts weight on the right lower extremity.  She took meloxicam and Tylenol this morning and the pain has eased up.  There is no stiffness in her hip joint or knee joint.  The calf muscles are soft, there is no redness swelling or tenderness.  Patient denies lower back pain.  There is no radicular pain on the back of the thighs and no tingling or numbness of her feet or toes. Suspect bursitis, will start Medrol Dosepak.  And then resume meloxicam once finished.    No other concerns at this time.   Past Medical History:  Diagnosis Date   Anxiety    Cataract    Depression    GERD (gastroesophageal reflux disease)    H/O seasonal allergies    Hyperlipidemia    Hypertension     Past Surgical History:  Procedure Laterality Date   BREAST BIOPSY Right 07/13/2015   benign   COLONOSCOPY WITH PROPOFOL N/A 09/20/2015   Procedure: COLONOSCOPY WITH PROPOFOL;  Surgeon: Christena Deem, MD;  Location: [facility] ENDOSCOPY;  Service: Endoscopy;  Laterality: N/A;   LIPOMA EXCISION Right 03/29/2018   Procedure: EXCISION LIPOMA- RIGHT FOREARM;  Surgeon: Griselda Miner, MD;  Location: [facility] ORS;  Service: General;  Laterality: Right;   TUBAL LIGATION      Social History   Socioeconomic History   Marital status: Widowed    Spouse name: Not on file   Number of children: Not on file   Years of education: Not on file   Highest education level: Not on file  Occupational History   Not on file  Tobacco Use   Smoking status: Former    Current packs/day: 0.00    Types: Cigarettes    Quit date: 09/19/1994     Years since quitting: 28.7   Smokeless tobacco: Never  Vaping Use   Vaping status: Never Used  Substance and Sexual Activity   Alcohol use: No   Drug use: No   Sexual activity: Not Currently    Birth control/protection: Post-menopausal  Other Topics Concern   Not on file  Social History Narrative   Not on file   Social Determinants of Health   Financial Resource Strain: Not on file  Food Insecurity: Not on file  Transportation Needs: Not on file  Physical Activity: Not on file  Stress: Not on file  Social Connections: Not on file  Intimate Partner Violence: Not on file    Family History  Problem Relation Age of Onset   Stroke Mother        or MI   Heart disease Mother    Alcohol abuse Father    Alcohol abuse Sister    Hypertension Son    Breast cancer Neg Hx     No Known Allergies  Review of Systems  Constitutional: Negative.  Negative for chills, diaphoresis, fever and malaise/fatigue.  HENT: Negative.    Eyes: Negative.   Respiratory: Negative.  Negative for cough and shortness of breath.   Cardiovascular:  Negative.  Negative for chest pain, palpitations and leg swelling.  Gastrointestinal: Negative.  Negative for abdominal pain, constipation, diarrhea, heartburn, nausea and vomiting.  Genitourinary: Negative.  Negative for dysuria and flank pain.  Musculoskeletal:  Positive for joint pain and myalgias.  Skin: Negative.   Neurological: Negative.  Negative for dizziness and headaches.  Endo/Heme/Allergies: Negative.   Psychiatric/Behavioral: Negative.  Negative for depression and suicidal ideas. The patient is not nervous/anxious.        Objective:   BP (!) 142/88   Pulse 70   Ht 4\' 11"  (1.499 m)   Wt 125 lb 12.8 oz (57.1 kg)   SpO2 95%   BMI 25.41 kg/m   Vitals:   06/07/23 1058  BP: (!) 142/88  Pulse: 70  Height: 4\' 11"  (1.499 m)  Weight: 125 lb 12.8 oz (57.1 kg)  SpO2: 95%  BMI (Calculated): 25.4    Physical Exam Vitals and nursing  note reviewed.  Constitutional:      Appearance: Normal appearance.  HENT:     Head: Normocephalic and atraumatic.     Nose: Nose normal.     Mouth/Throat:     Mouth: Mucous membranes are moist.     Pharynx: Oropharynx is clear.  Eyes:     Conjunctiva/sclera: Conjunctivae normal.     Pupils: Pupils are equal, round, and reactive to light.  Cardiovascular:     Rate and Rhythm: Normal rate and regular rhythm.     Pulses: Normal pulses.     Heart sounds: Normal heart sounds. No murmur heard. Pulmonary:     Effort: Pulmonary effort is normal.     Breath sounds: Normal breath sounds. No wheezing.  Abdominal:     General: Bowel sounds are normal.     Palpations: Abdomen is soft.     Tenderness: There is no abdominal tenderness. There is no right CVA tenderness or left CVA tenderness.  Musculoskeletal:        General: Normal range of motion.     Cervical back: Normal range of motion.     Right lower leg: No edema.     Left lower leg: No edema.  Skin:    General: Skin is warm and dry.  Neurological:     General: No focal deficit present.     Mental Status: She is alert and oriented to person, place, and time.  Psychiatric:        Mood and Affect: Mood normal.        Behavior: Behavior normal.      No results found for any visits on 06/07/23.  Recent Results (from the past 2160 hour(s))  CMP14+EGFR     Status: Abnormal   Collection Time: 05/25/23  9:48 AM  Result Value Ref Range   Glucose 85 70 - 99 mg/dL   BUN 23 8 - 27 mg/dL   Creatinine, Ser 3.55 0.57 - 1.00 mg/dL   eGFR 79 >73 UK/GUR/4.27   BUN/Creatinine Ratio 29 (H) 12 - 28   Sodium 142 134 - 144 mmol/L   Potassium 5.0 3.5 - 5.2 mmol/L   Chloride 103 96 - 106 mmol/L   CO2 26 20 - 29 mmol/L   Calcium 9.9 8.7 - 10.3 mg/dL   Total Protein 6.0 6.0 - 8.5 g/dL   Albumin 4.2 3.8 - 4.8 g/dL   Globulin, Total 1.8 1.5 - 4.5 g/dL   Bilirubin Total 0.3 0.0 - 1.2 mg/dL   Alkaline Phosphatase 83 44 - 121 IU/L   AST  16 0 -  40 IU/L   ALT 14 0 - 32 IU/L  Lipid Panel w/o Chol/HDL Ratio     Status: None   Collection Time: 05/25/23  9:48 AM  Result Value Ref Range   Cholesterol, Total 145 100 - 199 mg/dL   Triglycerides 66 0 - 149 mg/dL   HDL 57 >82 mg/dL   VLDL Cholesterol Cal 13 5 - 40 mg/dL   LDL Chol Calc (NIH) 75 0 - 99 mg/dL      Assessment & Plan:  Start Medrol Dosepak.  Can use meloxicam once finished.  Follow-up in 10 days.  If pain gets worse. Problem List Items Addressed This Visit     Essential hypertension, benign   Hyperlipidemia   GAD (generalized anxiety disorder)   Other Visit Diagnoses     Trochanteric bursitis of left hip    -  Primary   Relevant Medications   methylPREDNISolone (MEDROL DOSEPAK) 4 MG TBPK tablet       Follow up in 10 days.  Total time spent: 30 minutes  Margaretann Loveless, MD  06/07/2023   This document may have been prepared by [facility] Voice Recognition software and as such may include unintentional dictation errors.

## 2023-06-18 ENCOUNTER — Encounter: Payer: Self-pay | Admitting: Internal Medicine

## 2023-06-18 ENCOUNTER — Ambulatory Visit
Admission: RE | Admit: 2023-06-18 | Discharge: 2023-06-18 | Disposition: A | Discharge status: Home / Self Care | Payer: 59 | Attending: Internal Medicine | Admitting: Internal Medicine

## 2023-06-18 ENCOUNTER — Other Ambulatory Visit: Payer: Self-pay | Admitting: Internal Medicine

## 2023-06-18 ENCOUNTER — Ambulatory Visit (INDEPENDENT_AMBULATORY_CARE_PROVIDER_SITE_OTHER): Payer: 59 | Admitting: Internal Medicine

## 2023-06-18 ENCOUNTER — Ambulatory Visit
Admission: RE | Admit: 2023-06-18 | Discharge: 2023-06-18 | Disposition: A | Discharge status: Home / Self Care | Payer: 59 | Source: Ambulatory Visit | Attending: Internal Medicine | Admitting: Internal Medicine

## 2023-06-18 VITALS — BP 130/86 | HR 79 | Ht 59.0 in | Wt 127.2 lb

## 2023-06-18 DIAGNOSIS — G8929 Other chronic pain: Secondary | ICD-10-CM | POA: Diagnosis not present

## 2023-06-18 DIAGNOSIS — M25561 Pain in right knee: Secondary | ICD-10-CM | POA: Insufficient documentation

## 2023-06-18 DIAGNOSIS — E782 Mixed hyperlipidemia: Secondary | ICD-10-CM | POA: Diagnosis not present

## 2023-06-18 DIAGNOSIS — F411 Generalized anxiety disorder: Secondary | ICD-10-CM

## 2023-06-18 DIAGNOSIS — I10 Essential (primary) hypertension: Secondary | ICD-10-CM | POA: Diagnosis not present

## 2023-06-18 NOTE — Progress Notes (Signed)
Established Patient Office Visit  Subjective:  Patient ID: Allison Harvey, female    DOB: [birth year]  Age: 72 y.o. MRN: [id number]  Chief Complaint  Patient presents with   Follow-up    10 day follow up    Patient comes in for follow-up of her right lower extremity pain.  At her last visit she had complained that this pain starts from her right hip and then goes all the way down to her foot gets worse when she stands up on it.  She was given a prescription for Medrol Dosepak which she has completed and now is taking meloxicam and Tylenol as needed.  However she reports that her pain is now more localized to her right knee and feels morning stiffness pain on weightbearing.  However after she has taken her Tylenol she feels no pain at all.  Will get x-ray of her right knee. May need orthopedic consult depending upon the result.  Meanwhile continue meloxicam and Tylenol as needed.  Also at home exercises.  Consider physical therapy. Blood pressure is slightly above normal although better than the last time.  Will continue to monitor. Anxiety is under good control.    No other concerns at this time.   Past Medical History:  Diagnosis Date   Anxiety    Cataract    Depression    GERD (gastroesophageal reflux disease)    H/O seasonal allergies    Hyperlipidemia    Hypertension     Past Surgical History:  Procedure Laterality Date   BREAST BIOPSY Right 07/13/2015   benign   COLONOSCOPY WITH PROPOFOL N/A 09/20/2015   Procedure: COLONOSCOPY WITH PROPOFOL;  Surgeon: Christena Deem, MD;  Location: [facility] ENDOSCOPY;  Service: Endoscopy;  Laterality: N/A;   LIPOMA EXCISION Right 03/29/2018   Procedure: EXCISION LIPOMA- RIGHT FOREARM;  Surgeon: Griselda Miner, MD;  Location: [facility] ORS;  Service: General;  Laterality: Right;   TUBAL LIGATION      Social History   Socioeconomic History   Marital status: Widowed    Spouse name: Not on file   Number of children: Not on file   Years of  education: Not on file   Highest education level: Not on file  Occupational History   Not on file  Tobacco Use   Smoking status: Former    Current packs/day: 0.00    Types: Cigarettes    Quit date: 09/19/1994    Years since quitting: 28.7   Smokeless tobacco: Never  Vaping Use   Vaping status: Never Used  Substance and Sexual Activity   Alcohol use: No   Drug use: No   Sexual activity: Not Currently    Birth control/protection: Post-menopausal  Other Topics Concern   Not on file  Social History Narrative   Not on file   Social Determinants of Health   Financial Resource Strain: Not on file  Food Insecurity: Not on file  Transportation Needs: Not on file  Physical Activity: Not on file  Stress: Not on file  Social Connections: Not on file  Intimate Partner Violence: Not on file    Family History  Problem Relation Age of Onset   Stroke Mother        or MI   Heart disease Mother    Alcohol abuse Father    Alcohol abuse Sister    Hypertension Son    Breast cancer Neg Hx     No Known Allergies  Review of Systems  Constitutional: Negative.  Negative for chills, diaphoresis, fever, malaise/fatigue and weight loss.  HENT: Negative.    Eyes: Negative.   Respiratory: Negative.  Negative for cough and shortness of breath.   Cardiovascular: Negative.  Negative for chest pain, palpitations and leg swelling.  Gastrointestinal: Negative.  Negative for abdominal pain, constipation, diarrhea, heartburn, nausea and vomiting.  Genitourinary: Negative.  Negative for dysuria and flank pain.  Musculoskeletal:  Positive for joint pain (Right knee). Negative for myalgias.  Skin: Negative.   Neurological: Negative.  Negative for dizziness and headaches.  Endo/Heme/Allergies: Negative.   Psychiatric/Behavioral: Negative.  Negative for depression and suicidal ideas. The patient is not nervous/anxious.        Objective:   BP 130/86   Pulse 79   Ht 4\' 11"  (1.499 m)   Wt 127 lb  3.2 oz (57.7 kg)   SpO2 97%   BMI 25.69 kg/m   Vitals:   06/18/23 0933  BP: 130/86  Pulse: 79  Height: 4\' 11"  (1.499 m)  Weight: 127 lb 3.2 oz (57.7 kg)  SpO2: 97%  BMI (Calculated): 25.68    Physical Exam Vitals and nursing note reviewed.  Constitutional:      Appearance: Normal appearance.  HENT:     Head: Normocephalic and atraumatic.     Nose: Nose normal.     Mouth/Throat:     Mouth: Mucous membranes are moist.     Pharynx: Oropharynx is clear.  Eyes:     Conjunctiva/sclera: Conjunctivae normal.     Pupils: Pupils are equal, round, and reactive to light.  Cardiovascular:     Rate and Rhythm: Normal rate and regular rhythm.     Pulses: Normal pulses.     Heart sounds: Normal heart sounds. No murmur heard. Pulmonary:     Effort: Pulmonary effort is normal.     Breath sounds: Normal breath sounds. No wheezing.  Abdominal:     General: Bowel sounds are normal.     Palpations: Abdomen is soft.     Tenderness: There is no abdominal tenderness. There is no right CVA tenderness or left CVA tenderness.  Musculoskeletal:        General: Normal range of motion.     Cervical back: Normal range of motion.     Right lower leg: No edema.     Left lower leg: No edema.  Skin:    General: Skin is warm and dry.  Neurological:     General: No focal deficit present.     Mental Status: She is alert and oriented to person, place, and time.  Psychiatric:        Mood and Affect: Mood normal.        Behavior: Behavior normal.      No results found for any visits on 06/18/23.  Recent Results (from the past 2160 hour(s))  CMP14+EGFR     Status: Abnormal   Collection Time: 05/25/23  9:48 AM  Result Value Ref Range   Glucose 85 70 - 99 mg/dL   BUN 23 8 - 27 mg/dL   Creatinine, Ser 8.11 0.57 - 1.00 mg/dL   eGFR 79 >91 YN/WGN/5.62   BUN/Creatinine Ratio 29 (H) 12 - 28   Sodium 142 134 - 144 mmol/L   Potassium 5.0 3.5 - 5.2 mmol/L   Chloride 103 96 - 106 mmol/L   CO2 26 20 -  29 mmol/L   Calcium 9.9 8.7 - 10.3 mg/dL   Total Protein 6.0 6.0 - 8.5 g/dL   Albumin 4.2  3.8 - 4.8 g/dL   Globulin, Total 1.8 1.5 - 4.5 g/dL   Bilirubin Total 0.3 0.0 - 1.2 mg/dL   Alkaline Phosphatase 83 44 - 121 IU/L   AST 16 0 - 40 IU/L   ALT 14 0 - 32 IU/L  Lipid Panel w/o Chol/HDL Ratio     Status: None   Collection Time: 05/25/23  9:48 AM  Result Value Ref Range   Cholesterol, Total 145 100 - 199 mg/dL   Triglycerides 66 0 - 149 mg/dL   HDL 57 >93 mg/dL   VLDL Cholesterol Cal 13 5 - 40 mg/dL   LDL Chol Calc (NIH) 75 0 - 99 mg/dL      Assessment & Plan:  Check x-ray of her right knee.  Continue Tylenol and meloxicam as needed.  Monitor blood pressure. Problem List Items Addressed This Visit     Essential hypertension, benign   Hyperlipidemia   GAD (generalized anxiety disorder)   Other Visit Diagnoses     Chronic pain of right knee    -  Primary   Relevant Orders   DG Knee Complete 4 Views Right       Return in about 3 weeks (around 07/09/2023).   Total time spent: 30 minutes  Margaretann Loveless, MD  06/18/2023   This document may have been prepared by [facility] Voice Recognition software and as such may include unintentional dictation errors.

## 2023-07-01 ENCOUNTER — Other Ambulatory Visit: Payer: Self-pay | Admitting: Internal Medicine

## 2023-07-01 DIAGNOSIS — F411 Generalized anxiety disorder: Secondary | ICD-10-CM

## 2023-07-06 ENCOUNTER — Other Ambulatory Visit: Payer: Self-pay

## 2023-07-06 DIAGNOSIS — F411 Generalized anxiety disorder: Secondary | ICD-10-CM

## 2023-07-10 ENCOUNTER — Ambulatory Visit (INDEPENDENT_AMBULATORY_CARE_PROVIDER_SITE_OTHER): Payer: 59 | Admitting: Internal Medicine

## 2023-07-10 ENCOUNTER — Encounter: Payer: Self-pay | Admitting: Internal Medicine

## 2023-07-10 VITALS — BP 136/90 | HR 73 | Ht 59.0 in | Wt 128.8 lb

## 2023-07-10 DIAGNOSIS — M25561 Pain in right knee: Secondary | ICD-10-CM

## 2023-07-10 DIAGNOSIS — G8929 Other chronic pain: Secondary | ICD-10-CM

## 2023-07-10 DIAGNOSIS — F411 Generalized anxiety disorder: Secondary | ICD-10-CM

## 2023-07-10 DIAGNOSIS — I10 Essential (primary) hypertension: Secondary | ICD-10-CM

## 2023-07-10 DIAGNOSIS — E782 Mixed hyperlipidemia: Secondary | ICD-10-CM | POA: Diagnosis not present

## 2023-07-10 MED ORDER — FLUOXETINE HCL 10 MG PO CAPS: 10.0000 mg | ORAL_CAPSULE | Freq: Every day | ORAL | 3 refills | Status: DC

## 2023-07-10 MED ORDER — LISINOPRIL 10 MG PO TABS: 10.0000 mg | ORAL_TABLET | Freq: Every day | ORAL | 3 refills | Status: DC

## 2023-07-10 NOTE — Progress Notes (Signed)
Patient notified

## 2023-07-10 NOTE — Progress Notes (Signed)
Established Patient Office Visit  Subjective:  Patient ID: Allison Harvey, female    DOB: 07-06-1951  Age: 72 y.o. MRN: [id number]  Chief Complaint  Patient presents with   Follow-up    3 week follow up    Patient comes in for follow-up of her right knee pain which has now completely resolved with the help of meloxicam.  Her x-ray of the right knee was completely normal.  Patient feels well now.  However her blood pressure is still above normal.  Agrees to increase the dose of lisinopril to 10 mg.  Will start taking 2 of her 5 mg tablets until the bottle is finished, then pick up the new prescription. Patient is also taking her Prozac regularly along with her BuSpar and her anxiety is under good control.    No other concerns at this time.   Past Medical History:  Diagnosis Date   Anxiety    Cataract    Depression    GERD (gastroesophageal reflux disease)    H/O seasonal allergies    Hyperlipidemia    Hypertension     Past Surgical History:  Procedure Laterality Date   BREAST BIOPSY Right 07/13/2015   benign   COLONOSCOPY WITH PROPOFOL N/A 09/20/2015   Procedure: COLONOSCOPY WITH PROPOFOL;  Surgeon: Christena Deem, MD;  Location: [facility] ENDOSCOPY;  Service: Endoscopy;  Laterality: N/A;   LIPOMA EXCISION Right 03/29/2018   Procedure: EXCISION LIPOMA- RIGHT FOREARM;  Surgeon: Griselda Miner, MD;  Location: [facility] ORS;  Service: General;  Laterality: Right;   TUBAL LIGATION      Social History   Socioeconomic History   Marital status: Widowed    Spouse name: Not on file   Number of children: Not on file   Years of education: Not on file   Highest education level: Not on file  Occupational History   Not on file  Tobacco Use   Smoking status: Former    Current packs/day: 0.00    Types: Cigarettes    Quit date: 09/19/1994    Years since quitting: 28.8   Smokeless tobacco: Never  Vaping Use   Vaping status: Never Used  Substance and Sexual Activity   Alcohol use: No    Drug use: No   Sexual activity: Not Currently    Birth control/protection: Post-menopausal  Other Topics Concern   Not on file  Social History Narrative   Not on file   Social Determinants of Health   Financial Resource Strain: Not on file  Food Insecurity: Not on file  Transportation Needs: Not on file  Physical Activity: Not on file  Stress: Not on file  Social Connections: Not on file  Intimate Partner Violence: Not on file    Family History  Problem Relation Age of Onset   Stroke Mother        or MI   Heart disease Mother    Alcohol abuse Father    Alcohol abuse Sister    Hypertension Son    Breast cancer Neg Hx     No Known Allergies  Review of Systems  Constitutional: Negative.  Negative for chills, diaphoresis, fever, malaise/fatigue and weight loss.  HENT: Negative.    Eyes: Negative.   Respiratory: Negative.  Negative for cough and shortness of breath.   Cardiovascular: Negative.  Negative for chest pain, palpitations and leg swelling.  Gastrointestinal: Negative.  Negative for abdominal pain, constipation, diarrhea, heartburn, nausea and vomiting.  Genitourinary: Negative.  Negative for dysuria and  flank pain.  Musculoskeletal: Negative.  Negative for joint pain and myalgias.  Skin: Negative.   Neurological: Negative.  Negative for dizziness and headaches.  Endo/Heme/Allergies: Negative.   Psychiatric/Behavioral: Negative.  Negative for depression and suicidal ideas. The patient is not nervous/anxious.        Objective:   BP (!) 136/90   Pulse 73   Ht 4\' 11"  (1.499 m)   Wt 128 lb 12.8 oz (58.4 kg)   SpO2 97%   BMI 26.01 kg/m   Vitals:   07/10/23 0940  BP: (!) 136/90  Pulse: 73  Height: 4\' 11"  (1.499 m)  Weight: 128 lb 12.8 oz (58.4 kg)  SpO2: 97%  BMI (Calculated): 26    Physical Exam Vitals and nursing note reviewed.  Constitutional:      Appearance: Normal appearance.  HENT:     Head: Normocephalic and atraumatic.     Nose:  Nose normal.     Mouth/Throat:     Mouth: Mucous membranes are moist.     Pharynx: Oropharynx is clear.  Eyes:     Conjunctiva/sclera: Conjunctivae normal.     Pupils: Pupils are equal, round, and reactive to light.  Cardiovascular:     Rate and Rhythm: Normal rate and regular rhythm.     Pulses: Normal pulses.     Heart sounds: Normal heart sounds. No murmur heard. Pulmonary:     Effort: Pulmonary effort is normal.     Breath sounds: Normal breath sounds. No wheezing.  Abdominal:     General: Bowel sounds are normal.     Palpations: Abdomen is soft.     Tenderness: There is no abdominal tenderness. There is no right CVA tenderness or left CVA tenderness.  Musculoskeletal:        General: Normal range of motion.     Cervical back: Normal range of motion.     Right lower leg: No edema.     Left lower leg: No edema.  Skin:    General: Skin is warm and dry.  Neurological:     General: No focal deficit present.     Mental Status: She is alert and oriented to person, place, and time.  Psychiatric:        Mood and Affect: Mood normal.        Behavior: Behavior normal.      No results found for any visits on 07/10/23.  Recent Results (from the past 2160 hour(s))  CMP14+EGFR     Status: Abnormal   Collection Time: 05/25/23  9:48 AM  Result Value Ref Range   Glucose 85 70 - 99 mg/dL   BUN 23 8 - 27 mg/dL   Creatinine, Ser 0.62 0.57 - 1.00 mg/dL   eGFR 79 >37 SE/GBT/5.17   BUN/Creatinine Ratio 29 (H) 12 - 28   Sodium 142 134 - 144 mmol/L   Potassium 5.0 3.5 - 5.2 mmol/L   Chloride 103 96 - 106 mmol/L   CO2 26 20 - 29 mmol/L   Calcium 9.9 8.7 - 10.3 mg/dL   Total Protein 6.0 6.0 - 8.5 g/dL   Albumin 4.2 3.8 - 4.8 g/dL   Globulin, Total 1.8 1.5 - 4.5 g/dL   Bilirubin Total 0.3 0.0 - 1.2 mg/dL   Alkaline Phosphatase 83 44 - 121 IU/L   AST 16 0 - 40 IU/L   ALT 14 0 - 32 IU/L  Lipid Panel w/o Chol/HDL Ratio     Status: None   Collection Time: 05/25/23  9:48 AM  Result  Value Ref Range   Cholesterol, Total 145 100 - 199 mg/dL   Triglycerides 66 0 - 149 mg/dL   HDL 57 >91 mg/dL   VLDL Cholesterol Cal 13 5 - 40 mg/dL   LDL Chol Calc (NIH) 75 0 - 99 mg/dL      Assessment & Plan:  Increase dose of lisinopril to 10 mg/day.  Continue hydrochlorothiazide.  Monitor blood pressure at home also.. Can now take meloxicam only as needed for arthritic aches and pains. Problem List Items Addressed This Visit     Essential hypertension, benign - Primary   Relevant Medications   lisinopril (ZESTRIL) 10 MG tablet   Hyperlipidemia   Relevant Medications   lisinopril (ZESTRIL) 10 MG tablet   GAD (generalized anxiety disorder)   Relevant Medications   FLUoxetine (PROZAC) 10 MG capsule   Other Visit Diagnoses     Chronic pain of right knee           Follow up 1 month.  Total time spent: 30 minutes  Margaretann Loveless, MD  07/10/2023   This document may have been prepared by [facility] Voice Recognition software and as such may include unintentional dictation errors.

## 2023-07-12 DIAGNOSIS — H2513 Age-related nuclear cataract, bilateral: Secondary | ICD-10-CM | POA: Diagnosis not present

## 2023-07-12 DIAGNOSIS — H5203 Hypermetropia, bilateral: Secondary | ICD-10-CM | POA: Diagnosis not present

## 2023-08-06 ENCOUNTER — Other Ambulatory Visit: Payer: Self-pay | Admitting: Internal Medicine

## 2023-08-06 DIAGNOSIS — Z1231 Encounter for screening mammogram for malignant neoplasm of breast: Secondary | ICD-10-CM

## 2023-08-09 ENCOUNTER — Encounter: Payer: Self-pay | Admitting: Internal Medicine

## 2023-08-09 ENCOUNTER — Ambulatory Visit: Payer: 59 | Admitting: Internal Medicine

## 2023-08-09 VITALS — BP 140/90 | HR 77 | Ht 59.0 in | Wt 126.2 lb

## 2023-08-09 DIAGNOSIS — E782 Mixed hyperlipidemia: Secondary | ICD-10-CM

## 2023-08-09 DIAGNOSIS — I10 Essential (primary) hypertension: Secondary | ICD-10-CM | POA: Diagnosis not present

## 2023-08-09 DIAGNOSIS — F411 Generalized anxiety disorder: Secondary | ICD-10-CM

## 2023-08-09 DIAGNOSIS — Z0000 Encounter for general adult medical examination without abnormal findings: Secondary | ICD-10-CM

## 2023-08-09 DIAGNOSIS — J301 Allergic rhinitis due to pollen: Secondary | ICD-10-CM

## 2023-08-09 MED ORDER — LISINOPRIL-HYDROCHLOROTHIAZIDE 20-12.5 MG PO TABS: 1.0000 | ORAL_TABLET | Freq: Every day | ORAL | 11 refills | Status: DC

## 2023-08-09 MED ORDER — FLUOXETINE HCL 20 MG PO CAPS: 20.0000 mg | ORAL_CAPSULE | Freq: Every day | ORAL | 2 refills | Status: DC

## 2023-08-09 MED ORDER — FLUTICASONE PROPIONATE 50 MCG/ACT NA SUSP
1.0000 | Freq: Every day | NASAL | 5 refills | Status: AC | PRN
Start: 2023-08-09 — End: ?

## 2023-08-09 NOTE — Progress Notes (Signed)
Established Patient Office Visit  Subjective:  Patient ID: Allison Harvey, female    DOB: 01-11-1951  Age: 72 y.o. MRN: [id number]  Chief Complaint  Patient presents with   Annual Exam    awv    Patient comes in for her follow-up as well as her annual wellness visit.  Her blood pressure is looking better but still above normal.  Will switch and increase the dose to lisinopril/HCTZ 20/12.5. Patient has history of anxiety and depression and is currently on Prozac 10 mg only and BuSpar.   Her PHQ-9/GAD score is 5/12.  She would like to increase her Prozac to 20 mg/day. She is up-to-date on her DEXA and colonoscopy.   Mammogram is scheduled for next month.    No other concerns at this time.   Past Medical History:  Diagnosis Date   Anxiety    Cataract    Depression    GERD (gastroesophageal reflux disease)    H/O seasonal allergies    Hyperlipidemia    Hypertension     Past Surgical History:  Procedure Laterality Date   BREAST BIOPSY Right 07/13/2015   benign   COLONOSCOPY WITH PROPOFOL N/A 09/20/2015   Procedure: COLONOSCOPY WITH PROPOFOL;  Surgeon: Christena Deem, MD;  Location: [facility] ENDOSCOPY;  Service: Endoscopy;  Laterality: N/A;   LIPOMA EXCISION Right 03/29/2018   Procedure: EXCISION LIPOMA- RIGHT FOREARM;  Surgeon: Griselda Miner, MD;  Location: [facility] ORS;  Service: General;  Laterality: Right;   TUBAL LIGATION      Social History   Socioeconomic History   Marital status: Widowed    Spouse name: Not on file   Number of children: Not on file   Years of education: Not on file   Highest education level: Not on file  Occupational History   Not on file  Tobacco Use   Smoking status: Former    Current packs/day: 0.00    Types: Cigarettes    Quit date: 09/19/1994    Years since quitting: 28.9   Smokeless tobacco: Never  Vaping Use   Vaping status: Never Used  Substance and Sexual Activity   Alcohol use: No   Drug use: No   Sexual activity: Not Currently     Birth control/protection: Post-menopausal  Other Topics Concern   Not on file  Social History Narrative   Not on file   Social Determinants of Health   Financial Resource Strain: Low Risk  (08/09/2023)   Overall Financial Resource Strain (CARDIA)    Difficulty of Paying Living Expenses: Not hard at all  Food Insecurity: No Food Insecurity (08/09/2023)   Hunger Vital Sign    Worried About Running Out of Food in the Last Year: Never true    Ran Out of Food in the Last Year: Never true  Transportation Needs: No Transportation Needs (08/09/2023)   PRAPARE - Administrator, Civil Service (Medical): No    Lack of Transportation (Non-Medical): No  Physical Activity: Not on file  Stress: No Stress Concern Present (08/09/2023)   [employer] of Occupational Health - Occupational Stress Questionnaire    Feeling of Stress : Only a little  Social Connections: Not on file  Intimate Partner Violence: Not At Risk (08/09/2023)   Humiliation, Afraid, Rape, and Kick questionnaire    Fear of Current or Ex-Partner: No    Emotionally Abused: No    Physically Abused: No    Sexually Abused: No    Family History  Problem Relation  Age of Onset   Stroke Mother        or MI   Heart disease Mother    Alcohol abuse Father    Alcohol abuse Sister    Hypertension Son    Breast cancer Neg Hx     No Known Allergies  Outpatient Medications Prior to Visit  Medication Sig Note   acetaminophen (TYLENOL) 500 MG tablet Take 1,000 mg by mouth daily as needed for moderate pain or headache.    alendronate (FOSAMAX) 70 MG tablet TAKE 1 TABLET BY MOUTH WEEKLY    atorvastatin (LIPITOR) 20 MG tablet TAKE 1 TABLET BY MOUTH ONCE  DAILY    busPIRone (BUSPAR) 15 MG tablet Take 1 tablet by mouth twice daily    Calcium Carb-Cholecalciferol 600-400 MG-UNIT CAPS Take by mouth.    Cholecalciferol (VITAMIN D-1000 MAX ST) 25 MCG (1000 UT) tablet Take 1,000 Units by mouth daily.    DENTA 5000 PLUS 1.1 %  CREA dental cream Place 1 Application onto teeth at bedtime.    meloxicam (MOBIC) 7.5 MG tablet Take 1 tablet (7.5 mg total) by mouth 2 (two) times daily.    Omega-3 Fatty Acids (FISH OIL) 1000 MG CAPS Take 1 capsule by mouth once.    polyethylene glycol (MIRALAX) 17 g packet Take 17 g by mouth daily.    vitamin B-12 (CYANOCOBALAMIN) 500 MCG tablet Take 500 mcg by mouth daily.    [DISCONTINUED] FLUoxetine (PROZAC) 10 MG capsule Take 1 capsule (10 mg total) by mouth daily.    [DISCONTINUED] fluticasone (FLONASE) 50 MCG/ACT nasal spray Place 1 spray into both nostrils daily as needed for allergies or rhinitis. 08/09/2023: Needs fefill   [DISCONTINUED] hydrochlorothiazide (HYDRODIURIL) 12.5 MG tablet TAKE 1 TABLET BY MOUTH ONCE  DAILY    [DISCONTINUED] lisinopril (ZESTRIL) 10 MG tablet Take 1 tablet (10 mg total) by mouth daily.    [DISCONTINUED] methylPREDNISolone (MEDROL DOSEPAK) 4 MG TBPK tablet To use as directed (Patient not taking: Reported on 06/18/2023)    No facility-administered medications prior to visit.    Review of Systems  Constitutional: Negative.  Negative for chills, diaphoresis, fever, malaise/fatigue and weight loss.  HENT: Negative.  Negative for congestion, hearing loss, sinus pain and sore throat.   Eyes: Negative.   Respiratory: Negative.  Negative for cough and shortness of breath.   Cardiovascular: Negative.  Negative for chest pain, palpitations and leg swelling.  Gastrointestinal: Negative.  Negative for abdominal pain, constipation, diarrhea, heartburn, nausea and vomiting.  Genitourinary: Negative.  Negative for dysuria and flank pain.  Musculoskeletal: Negative.  Negative for back pain, joint pain and myalgias.  Skin: Negative.   Neurological: Negative.  Negative for dizziness and headaches.  Endo/Heme/Allergies: Negative.   Psychiatric/Behavioral:  Negative for depression, memory loss and suicidal ideas. The patient is nervous/anxious. The patient does not have  insomnia.        Objective:   BP (!) 140/90   Pulse 77   Ht 4\' 11"  (1.499 m)   Wt 126 lb 3.2 oz (57.2 kg)   SpO2 97%   BMI 25.49 kg/m   Vitals:   08/09/23 1050  BP: (!) 140/90  Pulse: 77  Height: 4\' 11"  (1.499 m)  Weight: 126 lb 3.2 oz (57.2 kg)  SpO2: 97%  BMI (Calculated): 25.48    Physical Exam Vitals and nursing note reviewed.  Constitutional:      Appearance: Normal appearance.  HENT:     Head: Normocephalic and atraumatic.     Nose:  Nose normal.     Mouth/Throat:     Mouth: Mucous membranes are moist.     Pharynx: Oropharynx is clear.  Eyes:     Conjunctiva/sclera: Conjunctivae normal.     Pupils: Pupils are equal, round, and reactive to light.  Cardiovascular:     Rate and Rhythm: Normal rate and regular rhythm.     Pulses: Normal pulses.     Heart sounds: Normal heart sounds. No murmur heard. Pulmonary:     Effort: Pulmonary effort is normal.     Breath sounds: Normal breath sounds. No wheezing.  Abdominal:     General: Bowel sounds are normal.     Palpations: Abdomen is soft.     Tenderness: There is no abdominal tenderness. There is no right CVA tenderness or left CVA tenderness.  Musculoskeletal:        General: Normal range of motion.     Cervical back: Normal range of motion.     Right lower leg: No edema.     Left lower leg: No edema.  Skin:    General: Skin is warm and dry.  Neurological:     General: No focal deficit present.     Mental Status: She is alert and oriented to person, place, and time.  Psychiatric:        Mood and Affect: Mood normal.        Behavior: Behavior normal.      No results found for any visits on 08/09/23.  Recent Results (from the past 2160 hour(s))  CMP14+EGFR     Status: Abnormal   Collection Time: 05/25/23  9:48 AM  Result Value Ref Range   Glucose 85 70 - 99 mg/dL   BUN 23 8 - 27 mg/dL   Creatinine, Ser 2.72 0.57 - 1.00 mg/dL   eGFR 79 >53 GU/YQI/3.47   BUN/Creatinine Ratio 29 (H) 12 - 28    Sodium 142 134 - 144 mmol/L   Potassium 5.0 3.5 - 5.2 mmol/L   Chloride 103 96 - 106 mmol/L   CO2 26 20 - 29 mmol/L   Calcium 9.9 8.7 - 10.3 mg/dL   Total Protein 6.0 6.0 - 8.5 g/dL   Albumin 4.2 3.8 - 4.8 g/dL   Globulin, Total 1.8 1.5 - 4.5 g/dL   Bilirubin Total 0.3 0.0 - 1.2 mg/dL   Alkaline Phosphatase 83 44 - 121 IU/L   AST 16 0 - 40 IU/L   ALT 14 0 - 32 IU/L  Lipid Panel w/o Chol/HDL Ratio     Status: None   Collection Time: 05/25/23  9:48 AM  Result Value Ref Range   Cholesterol, Total 145 100 - 199 mg/dL   Triglycerides 66 0 - 149 mg/dL   HDL 57 >42 mg/dL   VLDL Cholesterol Cal 13 5 - 40 mg/dL   LDL Chol Calc (NIH) 75 0 - 99 mg/dL      Assessment & Plan:    Allison Harvey was seen today for annual exam.  Diagnoses and all orders for this visit:  Annual wellness visit  Essential hypertension, benign -     lisinopril-hydrochlorothiazide (ZESTORETIC) 20-12.5 MG tablet; Take 1 tablet by mouth daily.  GAD (generalized anxiety disorder) -     FLUoxetine (PROZAC) 20 MG capsule; Take 1 capsule (20 mg total) by mouth daily.  Seasonal allergic rhinitis due to pollen -     fluticasone (FLONASE) 50 MCG/ACT nasal spray; Place 1 spray into both nostrils daily as needed for allergies or rhinitis.  Mixed hyperlipidemia     Follow up 3 weeks. Monitor blood pressure.   Total time spent: 30 minutes  Margaretann Loveless, MD  08/09/2023   This document may have been prepared by [facility] Voice Recognition software and as such may include unintentional dictation errors.

## 2023-08-24 ENCOUNTER — Ambulatory Visit: Payer: 59 | Admitting: Internal Medicine

## 2023-08-30 ENCOUNTER — Ambulatory Visit
Admission: RE | Admit: 2023-08-30 | Discharge: 2023-08-30 | Disposition: A | Discharge status: Home / Self Care | Payer: 59 | Source: Ambulatory Visit | Attending: Internal Medicine | Admitting: Internal Medicine

## 2023-08-30 DIAGNOSIS — Z1231 Encounter for screening mammogram for malignant neoplasm of breast: Secondary | ICD-10-CM | POA: Insufficient documentation

## 2023-08-31 ENCOUNTER — Encounter: Payer: Self-pay | Admitting: Internal Medicine

## 2023-08-31 ENCOUNTER — Ambulatory Visit (INDEPENDENT_AMBULATORY_CARE_PROVIDER_SITE_OTHER): Payer: 59 | Admitting: Internal Medicine

## 2023-08-31 VITALS — BP 120/82 | HR 73 | Ht 59.0 in | Wt 124.4 lb

## 2023-08-31 DIAGNOSIS — E782 Mixed hyperlipidemia: Secondary | ICD-10-CM | POA: Diagnosis not present

## 2023-08-31 DIAGNOSIS — I10 Essential (primary) hypertension: Secondary | ICD-10-CM | POA: Diagnosis not present

## 2023-08-31 DIAGNOSIS — F411 Generalized anxiety disorder: Secondary | ICD-10-CM

## 2023-08-31 NOTE — Progress Notes (Signed)
Established Patient Office Visit  Subjective:  Patient ID: Allison Harvey, female    DOB: [birth year]  Age: 72 y.o. MRN: [id number]  Chief Complaint  Patient presents with   Follow-up    3 week    Patient comes in for her follow-up accompanied by her husband.  She is tolerating the increased dose of Prozac at 20 mg and says she is feeling better.  Also her blood pressure looks much improved on the higher dose of Zestoretic. Denies any headaches or dizziness.  Will get blood work today.    No other concerns at this time.   Past Medical History:  Diagnosis Date   Anxiety    Cataract    Depression    GERD (gastroesophageal reflux disease)    H/O seasonal allergies    Hyperlipidemia    Hypertension     Past Surgical History:  Procedure Laterality Date   BREAST BIOPSY Right 07/13/2015   benign   COLONOSCOPY WITH PROPOFOL N/A 09/20/2015   Procedure: COLONOSCOPY WITH PROPOFOL;  Surgeon: Christena Deem, MD;  Location: [facility] ENDOSCOPY;  Service: Endoscopy;  Laterality: N/A;   LIPOMA EXCISION Right 03/29/2018   Procedure: EXCISION LIPOMA- RIGHT FOREARM;  Surgeon: Griselda Miner, MD;  Location: [facility] ORS;  Service: General;  Laterality: Right;   TUBAL LIGATION      Social History   Socioeconomic History   Marital status: Widowed    Spouse name: Not on file   Number of children: Not on file   Years of education: Not on file   Highest education level: Not on file  Occupational History   Not on file  Tobacco Use   Smoking status: Former    Current packs/day: 0.00    Types: Cigarettes    Quit date: 09/19/1994    Years since quitting: 28.9   Smokeless tobacco: Never  Vaping Use   Vaping status: Never Used  Substance and Sexual Activity   Alcohol use: No   Drug use: No   Sexual activity: Not Currently    Birth control/protection: Post-menopausal  Other Topics Concern   Not on file  Social History Narrative   Not on file   Social Drivers of Health   Financial  Resource Strain: Low Risk  (08/09/2023)   Overall Financial Resource Strain (CARDIA)    Difficulty of Paying Living Expenses: Not hard at all  Food Insecurity: No Food Insecurity (08/09/2023)   Hunger Vital Sign    Worried About Running Out of Food in the Last Year: Never true    Ran Out of Food in the Last Year: Never true  Transportation Needs: No Transportation Needs (08/09/2023)   PRAPARE - Administrator, Civil Service (Medical): No    Lack of Transportation (Non-Medical): No  Physical Activity: Not on file  Stress: No Stress Concern Present (08/09/2023)   [employer] of Occupational Health - Occupational Stress Questionnaire    Feeling of Stress : Only a little  Social Connections: Not on file  Intimate Partner Violence: Not At Risk (08/09/2023)   Humiliation, Afraid, Rape, and Kick questionnaire    Fear of Current or Ex-Partner: No    Emotionally Abused: No    Physically Abused: No    Sexually Abused: No    Family History  Problem Relation Age of Onset   Stroke Mother        or MI   Heart disease Mother    Alcohol abuse Father    Alcohol abuse  Sister    Hypertension Son    Breast cancer Neg Hx     No Known Allergies  Outpatient Medications Prior to Visit  Medication Sig   acetaminophen (TYLENOL) 500 MG tablet Take 1,000 mg by mouth daily as needed for moderate pain or headache.   alendronate (FOSAMAX) 70 MG tablet TAKE 1 TABLET BY MOUTH WEEKLY   atorvastatin (LIPITOR) 20 MG tablet TAKE 1 TABLET BY MOUTH ONCE  DAILY   busPIRone (BUSPAR) 15 MG tablet Take 1 tablet by mouth twice daily   Calcium Carb-Cholecalciferol 600-400 MG-UNIT CAPS Take by mouth.   Cholecalciferol (VITAMIN D-1000 MAX ST) 25 MCG (1000 UT) tablet Take 1,000 Units by mouth daily.   DENTA 5000 PLUS 1.1 % CREA dental cream Place 1 Application onto teeth at bedtime.   FLUoxetine (PROZAC) 20 MG capsule Take 1 capsule (20 mg total) by mouth daily.   fluticasone (FLONASE) 50 MCG/ACT nasal  spray Place 1 spray into both nostrils daily as needed for allergies or rhinitis.   lisinopril-hydrochlorothiazide (ZESTORETIC) 20-12.5 MG tablet Take 1 tablet by mouth daily.   meloxicam (MOBIC) 7.5 MG tablet Take 1 tablet (7.5 mg total) by mouth 2 (two) times daily.   Omega-3 Fatty Acids (FISH OIL) 1000 MG CAPS Take 1 capsule by mouth once.   polyethylene glycol (MIRALAX) 17 g packet Take 17 g by mouth daily.   vitamin B-12 (CYANOCOBALAMIN) 500 MCG tablet Take 500 mcg by mouth daily.   No facility-administered medications prior to visit.    Review of Systems  Constitutional: Negative.  Negative for chills, fever and weight loss.  HENT:  Negative for ear pain, nosebleeds and sinus pain.   Eyes: Negative.   Respiratory:  Negative for cough, shortness of breath and stridor.   Cardiovascular: Negative.  Negative for chest pain, palpitations and leg swelling.  Gastrointestinal: Negative.  Negative for abdominal pain, constipation, diarrhea, heartburn, nausea and vomiting.  Genitourinary: Negative.  Negative for dysuria and flank pain.  Musculoskeletal: Negative.  Negative for joint pain and myalgias.  Skin: Negative.   Neurological: Negative.  Negative for dizziness, tingling, tremors, sensory change and headaches.  Endo/Heme/Allergies: Negative.   Psychiatric/Behavioral: Negative.  Negative for depression and suicidal ideas. The patient is not nervous/anxious.        Objective:   BP 120/82   Pulse 73   Ht 4\' 11"  (1.499 m)   Wt 124 lb 6.4 oz (56.4 kg)   SpO2 97%   BMI 25.13 kg/m   Vitals:   08/31/23 1128  BP: 120/82  Pulse: 73  Height: 4\' 11"  (1.499 m)  Weight: 124 lb 6.4 oz (56.4 kg)  SpO2: 97%  BMI (Calculated): 25.11    Physical Exam Vitals and nursing note reviewed.  Constitutional:      Appearance: Normal appearance.  HENT:     Head: Normocephalic and atraumatic.     Nose: Nose normal.     Mouth/Throat:     Mouth: Mucous membranes are moist.     Pharynx:  Oropharynx is clear.  Eyes:     Conjunctiva/sclera: Conjunctivae normal.     Pupils: Pupils are equal, round, and reactive to light.  Cardiovascular:     Rate and Rhythm: Normal rate and regular rhythm.     Pulses: Normal pulses.     Heart sounds: Normal heart sounds. No murmur heard. Pulmonary:     Effort: Pulmonary effort is normal.     Breath sounds: Normal breath sounds. No wheezing.  Abdominal:  General: Bowel sounds are normal.     Palpations: Abdomen is soft.     Tenderness: There is no abdominal tenderness. There is no right CVA tenderness or left CVA tenderness.  Musculoskeletal:        General: Normal range of motion.     Cervical back: Normal range of motion.     Right lower leg: No edema.     Left lower leg: No edema.  Skin:    General: Skin is warm and dry.  Neurological:     General: No focal deficit present.     Mental Status: She is alert and oriented to person, place, and time.  Psychiatric:        Mood and Affect: Mood normal.        Behavior: Behavior normal.      No results found for any visits on 08/31/23.  No results found for this or any previous visit (from the past 2160 hours).    Assessment & Plan:  Patient advised to continue all her current medications.  Check labs. Problem List Items Addressed This Visit     Essential hypertension, benign - Primary   Relevant Orders   Lipid Panel w/o Chol/HDL Ratio   CMP14+EGFR   Hyperlipidemia   GAD (generalized anxiety disorder)    Follow up 3 months.   Total time spent: 30 minutes  Margaretann Loveless, MD  08/31/2023   This document may have been prepared by [facility] Voice Recognition software and as such may include unintentional dictation errors.

## 2023-09-01 LAB — LIPID PANEL W/O CHOL/HDL RATIO
Cholesterol, Total: 154 mg/dL (ref 100–199)
HDL: 60 mg/dL (ref >39)
LDL Chol Calc (NIH): 80 mg/dL (ref 0–99)
Triglycerides: 72 mg/dL (ref 0–149)
VLDL Cholesterol Cal: 14 mg/dL (ref 5–40)

## 2023-09-01 LAB — CMP14+EGFR
ALT: 15 [IU]/L (ref 0–32)
AST: 16 [IU]/L (ref 0–40)
Albumin: 4.3 g/dL (ref 3.8–4.8)
Alkaline Phosphatase: 88 [IU]/L (ref 44–121)
BUN/Creatinine Ratio: 27 (ref 12–28)
BUN: 20 mg/dL (ref 8–27)
Bilirubin Total: 0.3 mg/dL (ref 0.0–1.2)
CO2: 23 mmol/L (ref 20–29)
Calcium: 9.7 mg/dL (ref 8.7–10.3)
Chloride: 101 mmol/L (ref 96–106)
Creatinine, Ser: 0.75 mg/dL (ref 0.57–1.00)
Globulin, Total: 2.1 g/dL (ref 1.5–4.5)
Glucose: 84 mg/dL (ref 70–99)
Potassium: 4.4 mmol/L (ref 3.5–5.2)
Sodium: 141 mmol/L (ref 134–144)
Total Protein: 6.4 g/dL (ref 6.0–8.5)
eGFR: 83 mL/min/{1.73_m2} (ref >59)

## 2023-09-16 ENCOUNTER — Other Ambulatory Visit: Payer: Self-pay | Admitting: Internal Medicine

## 2023-09-16 DIAGNOSIS — E782 Mixed hyperlipidemia: Secondary | ICD-10-CM

## 2023-10-29 DIAGNOSIS — L570 Actinic keratosis: Secondary | ICD-10-CM | POA: Diagnosis not present

## 2023-10-29 DIAGNOSIS — Z85828 Personal history of other malignant neoplasm of skin: Secondary | ICD-10-CM | POA: Diagnosis not present

## 2023-10-29 DIAGNOSIS — D2272 Melanocytic nevi of left lower limb, including hip: Secondary | ICD-10-CM | POA: Diagnosis not present

## 2023-10-29 DIAGNOSIS — D485 Neoplasm of uncertain behavior of skin: Secondary | ICD-10-CM | POA: Diagnosis not present

## 2023-10-29 DIAGNOSIS — D0462 Carcinoma in situ of skin of left upper limb, including shoulder: Secondary | ICD-10-CM | POA: Diagnosis not present

## 2023-10-29 DIAGNOSIS — D2262 Melanocytic nevi of left upper limb, including shoulder: Secondary | ICD-10-CM | POA: Diagnosis not present

## 2023-10-29 DIAGNOSIS — D2271 Melanocytic nevi of right lower limb, including hip: Secondary | ICD-10-CM | POA: Diagnosis not present

## 2023-10-29 DIAGNOSIS — Z08 Encounter for follow-up examination after completed treatment for malignant neoplasm: Secondary | ICD-10-CM | POA: Diagnosis not present

## 2023-10-29 DIAGNOSIS — D2261 Melanocytic nevi of right upper limb, including shoulder: Secondary | ICD-10-CM | POA: Diagnosis not present

## 2023-10-29 DIAGNOSIS — D225 Melanocytic nevi of trunk: Secondary | ICD-10-CM | POA: Diagnosis not present

## 2023-10-29 DIAGNOSIS — L821 Other seborrheic keratosis: Secondary | ICD-10-CM | POA: Diagnosis not present

## 2023-10-30 DIAGNOSIS — H2513 Age-related nuclear cataract, bilateral: Secondary | ICD-10-CM | POA: Diagnosis not present

## 2023-11-14 DIAGNOSIS — D0462 Carcinoma in situ of skin of left upper limb, including shoulder: Secondary | ICD-10-CM | POA: Diagnosis not present

## 2023-11-19 DIAGNOSIS — H2513 Age-related nuclear cataract, bilateral: Secondary | ICD-10-CM | POA: Diagnosis not present

## 2023-11-21 DIAGNOSIS — H6123 Impacted cerumen, bilateral: Secondary | ICD-10-CM | POA: Diagnosis not present

## 2023-11-21 DIAGNOSIS — H903 Sensorineural hearing loss, bilateral: Secondary | ICD-10-CM | POA: Diagnosis not present

## 2023-11-29 ENCOUNTER — Encounter: Payer: Self-pay | Admitting: Internal Medicine

## 2023-11-29 ENCOUNTER — Ambulatory Visit (INDEPENDENT_AMBULATORY_CARE_PROVIDER_SITE_OTHER): Payer: 59 | Admitting: Internal Medicine

## 2023-11-29 VITALS — BP 134/78 | HR 81 | Ht 59.0 in | Wt 119.0 lb

## 2023-11-29 DIAGNOSIS — E782 Mixed hyperlipidemia: Secondary | ICD-10-CM

## 2023-11-29 DIAGNOSIS — R7303 Prediabetes: Secondary | ICD-10-CM

## 2023-11-29 DIAGNOSIS — I10 Essential (primary) hypertension: Secondary | ICD-10-CM | POA: Diagnosis not present

## 2023-11-29 DIAGNOSIS — F411 Generalized anxiety disorder: Secondary | ICD-10-CM | POA: Diagnosis not present

## 2023-11-29 NOTE — Progress Notes (Signed)
 Established Patient Office Visit  Subjective:  Patient ID: Allison Harvey, female    DOB: 17-Apr-1951  Age: 73 y.o. MRN: [id number]  Chief Complaint  Patient presents with   Follow-up    3 Months Follow Up    Patient comes in for follow-up.  She is accompanied by her daughter today.  She is generally feeling well and is getting ready for eye surgery.  She is trying to control her diet and  has managed to lose some weight.  Denies chest pain or shortness of breath, no palpitations.  No nausea or vomiting, no diarrhea or constipation. Patient is fasting for blood work today.    No other concerns at this time.   Past Medical History:  Diagnosis Date   Anxiety    Cataract    Depression    GERD (gastroesophageal reflux disease)    H/O seasonal allergies    Hyperlipidemia    Hypertension     Past Surgical History:  Procedure Laterality Date   BREAST BIOPSY Right 07/13/2015   benign   COLONOSCOPY WITH PROPOFOL N/A 09/20/2015   Procedure: COLONOSCOPY WITH PROPOFOL;  Surgeon: Christena Deem, MD;  Location: [facility] ENDOSCOPY;  Service: Endoscopy;  Laterality: N/A;   LIPOMA EXCISION Right 03/29/2018   Procedure: EXCISION LIPOMA- RIGHT FOREARM;  Surgeon: Griselda Miner, MD;  Location: [facility] ORS;  Service: General;  Laterality: Right;   TUBAL LIGATION      Social History   Socioeconomic History   Marital status: Widowed    Spouse name: Not on file   Number of children: Not on file   Years of education: Not on file   Highest education level: Not on file  Occupational History   Not on file  Tobacco Use   Smoking status: Former    Current packs/day: 0.00    Types: Cigarettes    Quit date: 09/19/1994    Years since quitting: 29.2   Smokeless tobacco: Never  Vaping Use   Vaping status: Never Used  Substance and Sexual Activity   Alcohol use: No   Drug use: No   Sexual activity: Not Currently    Birth control/protection: Post-menopausal  Other Topics Concern   Not on file   Social History Narrative   Not on file   Social Drivers of Health   Financial Resource Strain: Low Risk  (08/09/2023)   Overall Financial Resource Strain (CARDIA)    Difficulty of Paying Living Expenses: Not hard at all  Food Insecurity: No Food Insecurity (08/09/2023)   Hunger Vital Sign    Worried About Running Out of Food in the Last Year: Never true    Ran Out of Food in the Last Year: Never true  Transportation Needs: No Transportation Needs (08/09/2023)   PRAPARE - Administrator, Civil Service (Medical): No    Lack of Transportation (Non-Medical): No  Physical Activity: Not on file  Stress: No Stress Concern Present (08/09/2023)   [employer] of Occupational Health - Occupational Stress Questionnaire    Feeling of Stress : Only a little  Social Connections: Not on file  Intimate Partner Violence: Not At Risk (08/09/2023)   Humiliation, Afraid, Rape, and Kick questionnaire    Fear of Current or Ex-Partner: No    Emotionally Abused: No    Physically Abused: No    Sexually Abused: No    Family History  Problem Relation Age of Onset   Stroke Mother        or  MI   Heart disease Mother    Alcohol abuse Father    Alcohol abuse Sister    Hypertension Son    Breast cancer Neg Hx     No Known Allergies  Outpatient Medications Prior to Visit  Medication Sig   acetaminophen (TYLENOL) 500 MG tablet Take 1,000 mg by mouth daily as needed for moderate pain or headache.   alendronate (FOSAMAX) 70 MG tablet TAKE 1 TABLET BY MOUTH WEEKLY   atorvastatin (LIPITOR) 20 MG tablet TAKE 1 TABLET BY MOUTH ONCE  DAILY   busPIRone (BUSPAR) 15 MG tablet Take 1 tablet by mouth twice daily   Calcium Carb-Cholecalciferol 600-400 MG-UNIT CAPS Take by mouth.   Cholecalciferol (VITAMIN D-1000 MAX ST) 25 MCG (1000 UT) tablet Take 1,000 Units by mouth daily.   DENTA 5000 PLUS 1.1 % CREA dental cream Place 1 Application onto teeth at bedtime.   FLUoxetine (PROZAC) 20 MG capsule Take  1 capsule (20 mg total) by mouth daily.   fluticasone (FLONASE) 50 MCG/ACT nasal spray Place 1 spray into both nostrils daily as needed for allergies or rhinitis.   lisinopril-hydrochlorothiazide (ZESTORETIC) 20-12.5 MG tablet Take 1 tablet by mouth daily.   meloxicam (MOBIC) 7.5 MG tablet Take 1 tablet (7.5 mg total) by mouth 2 (two) times daily.   Omega-3 Fatty Acids (FISH OIL) 1000 MG CAPS Take 1 capsule by mouth once.   polyethylene glycol (MIRALAX) 17 g packet Take 17 g by mouth daily.   vitamin B-12 (CYANOCOBALAMIN) 500 MCG tablet Take 500 mcg by mouth daily.   No facility-administered medications prior to visit.    Review of Systems  Constitutional:  Negative for chills and fever.  HENT: Negative.  Negative for congestion, sinus pain and sore throat.   Eyes: Negative.  Negative for blurred vision.  Respiratory: Negative.  Negative for cough and shortness of breath.   Cardiovascular: Negative.  Negative for chest pain, palpitations and leg swelling.  Gastrointestinal: Negative.  Negative for abdominal pain, constipation, diarrhea, heartburn, nausea and vomiting.  Genitourinary: Negative.  Negative for dysuria and flank pain.  Musculoskeletal: Negative.  Negative for joint pain and myalgias.  Skin: Negative.   Neurological: Negative.  Negative for dizziness, tingling, tremors and headaches.  Endo/Heme/Allergies: Negative.   Psychiatric/Behavioral: Negative.  Negative for depression and suicidal ideas. The patient is not nervous/anxious.        Objective:   BP 134/78   Pulse 81   Ht 4\' 11"  (1.499 m)   Wt 119 lb (54 kg)   SpO2 95%   BMI 24.04 kg/m   Vitals:   11/29/23 0906  BP: 134/78  Pulse: 81  Height: 4\' 11"  (1.499 m)  Weight: 119 lb (54 kg)  SpO2: 95%  BMI (Calculated): 24.02    Physical Exam Vitals and nursing note reviewed.  Constitutional:      Appearance: Normal appearance.  HENT:     Head: Normocephalic and atraumatic.     Nose: Nose normal.      Mouth/Throat:     Mouth: Mucous membranes are moist.     Pharynx: Oropharynx is clear.  Eyes:     Conjunctiva/sclera: Conjunctivae normal.     Pupils: Pupils are equal, round, and reactive to light.  Cardiovascular:     Rate and Rhythm: Normal rate and regular rhythm.     Pulses: Normal pulses.     Heart sounds: Normal heart sounds. No murmur heard. Pulmonary:     Effort: Pulmonary effort is normal.  Breath sounds: Normal breath sounds. No wheezing.  Abdominal:     General: Bowel sounds are normal.     Palpations: Abdomen is soft.     Tenderness: There is no abdominal tenderness. There is no right CVA tenderness or left CVA tenderness.  Musculoskeletal:        General: Normal range of motion.     Cervical back: Normal range of motion.     Right lower leg: No edema.     Left lower leg: No edema.  Skin:    General: Skin is warm and dry.  Neurological:     General: No focal deficit present.     Mental Status: She is alert and oriented to person, place, and time.  Psychiatric:        Mood and Affect: Mood normal.        Behavior: Behavior normal.      No results found for any visits on 11/29/23.  Recent Results (from the past 2160 hours)  Lipid Panel w/o Chol/HDL Ratio     Status: None   Collection Time: 08/31/23 11:51 AM  Result Value Ref Range   Cholesterol, Total 154 100 - 199 mg/dL   Triglycerides 72 0 - 149 mg/dL   HDL 60 >16 mg/dL   VLDL Cholesterol Cal 14 5 - 40 mg/dL   LDL Chol Calc (NIH) 80 0 - 99 mg/dL  XWR60+AVWU     Status: None   Collection Time: 08/31/23 11:51 AM  Result Value Ref Range   Glucose 84 70 - 99 mg/dL   BUN 20 8 - 27 mg/dL   Creatinine, Ser 9.81 0.57 - 1.00 mg/dL   eGFR 83 >19 JY/NWG/9.56   BUN/Creatinine Ratio 27 12 - 28   Sodium 141 134 - 144 mmol/L   Potassium 4.4 3.5 - 5.2 mmol/L   Chloride 101 96 - 106 mmol/L   CO2 23 20 - 29 mmol/L   Calcium 9.7 8.7 - 10.3 mg/dL   Total Protein 6.4 6.0 - 8.5 g/dL   Albumin 4.3 3.8 - 4.8 g/dL    Globulin, Total 2.1 1.5 - 4.5 g/dL   Bilirubin Total 0.3 0.0 - 1.2 mg/dL   Alkaline Phosphatase 88 44 - 121 IU/L   AST 16 0 - 40 IU/L   ALT 15 0 - 32 IU/L      Assessment & Plan:  Continue medications.  Check labs.  To proceed with her eye surgery. Problem List Items Addressed This Visit     Essential hypertension, benign - Primary   Relevant Orders   CMP14+EGFR   Hyperlipidemia   Relevant Orders   Lipid Panel w/o Chol/HDL Ratio   GAD (generalized anxiety disorder)   Other Visit Diagnoses       Prediabetes       Relevant Orders   Hemoglobin A1c       Return in about 3 months (around 02/29/2024).   Total time spent: 30 minutes  Margaretann Loveless, MD  11/29/2023   This document may have been prepared by [facility] Voice Recognition software and as such may include unintentional dictation errors.

## 2023-11-30 LAB — CMP14+EGFR
ALT: 12 IU/L (ref 0–32)
AST: 16 IU/L (ref 0–40)
Albumin: 4.3 g/dL (ref 3.8–4.8)
Alkaline Phosphatase: 93 IU/L (ref 44–121)
BUN/Creatinine Ratio: 14 (ref 12–28)
BUN: 12 mg/dL (ref 8–27)
Bilirubin Total: 0.3 mg/dL (ref 0.0–1.2)
CO2: 25 mmol/L (ref 20–29)
Calcium: 9.7 mg/dL (ref 8.7–10.3)
Chloride: 102 mmol/L (ref 96–106)
Creatinine, Ser: 0.84 mg/dL (ref 0.57–1.00)
Globulin, Total: 2 g/dL (ref 1.5–4.5)
Glucose: 84 mg/dL (ref 70–99)
Potassium: 4.3 mmol/L (ref 3.5–5.2)
Sodium: 141 mmol/L (ref 134–144)
Total Protein: 6.3 g/dL (ref 6.0–8.5)
eGFR: 73 mL/min/{1.73_m2} (ref >59)

## 2023-11-30 LAB — HEMOGLOBIN A1C
Est. average glucose Bld gHb Est-mCnc: 128 mg/dL
Hgb A1c MFr Bld: 6.1 % — ABNORMAL HIGH (ref 4.8–5.6)

## 2023-11-30 LAB — LIPID PANEL W/O CHOL/HDL RATIO
Cholesterol, Total: 136 mg/dL (ref 100–199)
HDL: 56 mg/dL (ref >39)
LDL Chol Calc (NIH): 66 mg/dL (ref 0–99)
Triglycerides: 66 mg/dL (ref 0–149)
VLDL Cholesterol Cal: 14 mg/dL (ref 5–40)

## 2023-12-04 NOTE — Progress Notes (Signed)
 Patient notified

## 2023-12-06 ENCOUNTER — Other Ambulatory Visit: Payer: Self-pay

## 2023-12-06 ENCOUNTER — Other Ambulatory Visit: Payer: Self-pay | Admitting: Internal Medicine

## 2023-12-06 DIAGNOSIS — F411 Generalized anxiety disorder: Secondary | ICD-10-CM

## 2023-12-07 MED ORDER — ALENDRONATE SODIUM 70 MG PO TABS: 70.0000 mg | ORAL_TABLET | ORAL | 3 refills | Status: AC

## 2023-12-10 ENCOUNTER — Other Ambulatory Visit: Payer: Self-pay

## 2023-12-10 DIAGNOSIS — E782 Mixed hyperlipidemia: Secondary | ICD-10-CM

## 2023-12-10 MED ORDER — ATORVASTATIN CALCIUM 20 MG PO TABS: 20.0000 mg | ORAL_TABLET | Freq: Every day | ORAL | 2 refills | Status: DC

## 2023-12-18 DIAGNOSIS — H2513 Age-related nuclear cataract, bilateral: Secondary | ICD-10-CM | POA: Diagnosis not present

## 2023-12-18 DIAGNOSIS — Z7983 Long term (current) use of bisphosphonates: Secondary | ICD-10-CM | POA: Diagnosis not present

## 2023-12-18 DIAGNOSIS — H2512 Age-related nuclear cataract, left eye: Secondary | ICD-10-CM | POA: Diagnosis not present

## 2023-12-18 DIAGNOSIS — Z79899 Other long term (current) drug therapy: Secondary | ICD-10-CM | POA: Diagnosis not present

## 2024-01-01 ENCOUNTER — Ambulatory Visit: Admitting: Internal Medicine

## 2024-01-16 DIAGNOSIS — H43813 Vitreous degeneration, bilateral: Secondary | ICD-10-CM | POA: Diagnosis not present

## 2024-01-31 ENCOUNTER — Ambulatory Visit: Admitting: Internal Medicine

## 2024-02-27 DIAGNOSIS — H527 Unspecified disorder of refraction: Secondary | ICD-10-CM | POA: Diagnosis not present

## 2024-02-28 ENCOUNTER — Ambulatory Visit (INDEPENDENT_AMBULATORY_CARE_PROVIDER_SITE_OTHER): Admitting: Internal Medicine

## 2024-02-28 ENCOUNTER — Encounter: Payer: Self-pay | Admitting: Internal Medicine

## 2024-02-28 VITALS — BP 120/84 | HR 77 | Ht 59.0 in | Wt 119.0 lb

## 2024-02-28 DIAGNOSIS — R7303 Prediabetes: Secondary | ICD-10-CM | POA: Insufficient documentation

## 2024-02-28 DIAGNOSIS — F411 Generalized anxiety disorder: Secondary | ICD-10-CM

## 2024-02-28 DIAGNOSIS — I10 Essential (primary) hypertension: Secondary | ICD-10-CM | POA: Diagnosis not present

## 2024-02-28 DIAGNOSIS — E782 Mixed hyperlipidemia: Secondary | ICD-10-CM | POA: Diagnosis not present

## 2024-02-28 NOTE — Progress Notes (Signed)
 Established Patient Office Visit  Subjective:  Patient ID: Allison Harvey, female    DOB: 02/09/1951  Age: 73 y.o. MRN: [id number]  Chief Complaint  Patient presents with   Follow-up    Patient comes in for her follow-up today.  She is taking all her medications regularly and feels very well she denies any headaches or dizziness, no nausea vomiting, no abdominal pain.  Her blood pressure is under good control.  Her last set of labs was within normal limits.  Will skip this time.    No other concerns at this time.   Past Medical History:  Diagnosis Date   Anxiety    Cataract    Depression    GERD (gastroesophageal reflux disease)    H/O seasonal allergies    Hyperlipidemia    Hypertension     Past Surgical History:  Procedure Laterality Date   BREAST BIOPSY Right 07/13/2015   benign   COLONOSCOPY WITH PROPOFOL  N/A 09/20/2015   Procedure: COLONOSCOPY WITH PROPOFOL ;  Surgeon: Gladis RAYMOND Mariner, MD;  Location: [facility] ENDOSCOPY;  Service: Endoscopy;  Laterality: N/A;   LIPOMA EXCISION Right 03/29/2018   Procedure: EXCISION LIPOMA- RIGHT FOREARM;  Surgeon: Curvin Deward MOULD, MD;  Location: [facility] ORS;  Service: General;  Laterality: Right;   TUBAL LIGATION      Social History   Socioeconomic History   Marital status: Married    Spouse name: Not on file   Number of children: Not on file   Years of education: Not on file   Highest education level: Not on file  Occupational History   Not on file  Tobacco Use   Smoking status: Former    Current packs/day: 0.00    Types: Cigarettes    Quit date: 09/19/1994    Years since quitting: 29.4   Smokeless tobacco: Never  Vaping Use   Vaping status: Never Used  Substance and Sexual Activity   Alcohol use: No   Drug use: No   Sexual activity: Not Currently    Birth control/protection: Post-menopausal  Other Topics Concern   Not on file  Social History Narrative   Not on file   Social Drivers of Health   Financial Resource  Strain: Low Risk  (08/09/2023)   Overall Financial Resource Strain (CARDIA)    Difficulty of Paying Living Expenses: Not hard at all  Food Insecurity: No Food Insecurity (08/09/2023)   Hunger Vital Sign    Worried About Running Out of Food in the Last Year: Never true    Ran Out of Food in the Last Year: Never true  Transportation Needs: No Transportation Needs (08/09/2023)   PRAPARE - Administrator, Civil Service (Medical): No    Lack of Transportation (Non-Medical): No  Physical Activity: Not on file  Stress: No Stress Concern Present (08/09/2023)   [employer] of Occupational Health - Occupational Stress Questionnaire    Feeling of Stress : Only a little  Social Connections: Not on file  Intimate Partner Violence: Not At Risk (08/09/2023)   Humiliation, Afraid, Rape, and Kick questionnaire    Fear of Current or Ex-Partner: No    Emotionally Abused: No    Physically Abused: No    Sexually Abused: No    Family History  Problem Relation Age of Onset   Stroke Mother        or MI   Heart disease Mother    Alcohol abuse Father    Alcohol abuse Sister  Hypertension Son    Breast cancer Neg Hx     No Known Allergies  Outpatient Medications Prior to Visit  Medication Sig   acetaminophen (TYLENOL) 500 MG tablet Take 1,000 mg by mouth daily as needed for moderate pain or headache.   alendronate  (FOSAMAX ) 70 MG tablet Take 1 tablet (70 mg total) by mouth once a week.   atorvastatin  (LIPITOR) 20 MG tablet Take 1 tablet (20 mg total) by mouth daily.   busPIRone  (BUSPAR ) 15 MG tablet Take 1 tablet by mouth twice daily   Cholecalciferol (VITAMIN D-1000 MAX ST) 25 MCG (1000 UT) tablet Take 1,000 Units by mouth daily.   Cholecalciferol (VITAMIN D3) 50 MCG (2000 UT) CAPS Take 1 capsule by mouth daily.   DENTA 5000 PLUS 1.1 % CREA dental cream Place 1 Application onto teeth at bedtime.   FLUoxetine  (PROZAC ) 20 MG capsule Take 1 capsule by mouth once daily   fluticasone   (FLONASE ) 50 MCG/ACT nasal spray Place 1 spray into both nostrils daily as needed for allergies or rhinitis.   lisinopril -hydrochlorothiazide  (ZESTORETIC ) 20-12.5 MG tablet Take 1 tablet by mouth daily.   meloxicam  (MOBIC ) 7.5 MG tablet Take 1 tablet (7.5 mg total) by mouth 2 (two) times daily.   Omega-3 Fatty Acids (FISH OIL) 1000 MG CAPS Take 1 capsule by mouth once. (Patient taking differently: Take 1 capsule by mouth in the morning and at bedtime.)   polyethylene glycol (MIRALAX ) 17 g packet Take 17 g by mouth daily.   vitamin B-12 (CYANOCOBALAMIN) 500 MCG tablet Take 500 mcg by mouth daily.   Calcium  Carb-Cholecalciferol 600-400 MG-UNIT CAPS Take by mouth. (Patient not taking: Reported on 02/28/2024)   No facility-administered medications prior to visit.    Review of Systems  Constitutional: Negative.  Negative for chills, fever, malaise/fatigue and weight loss.  HENT: Negative.  Negative for sore throat.   Eyes: Negative.   Respiratory: Negative.  Negative for cough and shortness of breath.   Cardiovascular: Negative.  Negative for chest pain, palpitations and leg swelling.  Gastrointestinal: Negative.  Negative for abdominal pain, constipation, diarrhea, heartburn, nausea and vomiting.  Genitourinary: Negative.  Negative for dysuria and flank pain.  Musculoskeletal: Negative.  Negative for joint pain and myalgias.  Skin: Negative.   Neurological: Negative.  Negative for dizziness, tingling, tremors and headaches.  Endo/Heme/Allergies: Negative.   Psychiatric/Behavioral: Negative.  Negative for depression and suicidal ideas. The patient is not nervous/anxious.        Objective:   BP 120/84   Pulse 77   Ht 4' 11 (1.499 m)   Wt 119 lb (54 kg)   SpO2 99%   BMI 24.04 kg/m   Vitals:   02/28/24 0939  BP: 120/84  Pulse: 77  Height: 4' 11 (1.499 m)  Weight: 119 lb (54 kg)  SpO2: 99%  BMI (Calculated): 24.02    Physical Exam Vitals and nursing note reviewed.   Constitutional:      Appearance: Normal appearance.  HENT:     Head: Normocephalic and atraumatic.     Nose: Nose normal.     Mouth/Throat:     Mouth: Mucous membranes are moist.     Pharynx: Oropharynx is clear.   Eyes:     Conjunctiva/sclera: Conjunctivae normal.     Pupils: Pupils are equal, round, and reactive to light.    Cardiovascular:     Rate and Rhythm: Normal rate and regular rhythm.     Pulses: Normal pulses.     Heart sounds:  Normal heart sounds. No murmur heard. Pulmonary:     Effort: Pulmonary effort is normal.     Breath sounds: Normal breath sounds. No wheezing.  Abdominal:     General: Bowel sounds are normal.     Palpations: Abdomen is soft.     Tenderness: There is no abdominal tenderness. There is no right CVA tenderness or left CVA tenderness.   Musculoskeletal:        General: Normal range of motion.     Cervical back: Normal range of motion.     Right lower leg: No edema.     Left lower leg: No edema.   Skin:    General: Skin is warm and dry.   Neurological:     General: No focal deficit present.     Mental Status: She is alert and oriented to person, place, and time.   Psychiatric:        Mood and Affect: Mood normal.        Behavior: Behavior normal.      No results found for any visits on 02/28/24.  No results found for this or any previous visit (from the past 2160 hours).    Assessment & Plan:  Continue all medications.  Continue strict diet control emphasized. Problem List Items Addressed This Visit     Essential hypertension, benign - Primary   Hyperlipidemia   GAD (generalized anxiety disorder)   Prediabetes    Follow up 3 months.  Total time spent: 30 minutes  FERNAND FREDY RAMAN, MD  02/28/2024   This document may have been prepared by [facility] Voice Recognition software and as such may include unintentional dictation errors.

## 2024-04-30 DIAGNOSIS — Z947 Corneal transplant status: Secondary | ICD-10-CM | POA: Diagnosis not present

## 2024-05-14 ENCOUNTER — Other Ambulatory Visit: Payer: Self-pay | Admitting: Internal Medicine

## 2024-05-14 DIAGNOSIS — M25511 Pain in right shoulder: Secondary | ICD-10-CM

## 2024-05-30 ENCOUNTER — Ambulatory Visit (INDEPENDENT_AMBULATORY_CARE_PROVIDER_SITE_OTHER): Admitting: Cardiology

## 2024-05-30 ENCOUNTER — Encounter: Payer: Self-pay | Admitting: Cardiology

## 2024-05-30 VITALS — BP 124/78 | HR 70 | Ht 59.0 in | Wt 123.0 lb

## 2024-05-30 DIAGNOSIS — E782 Mixed hyperlipidemia: Secondary | ICD-10-CM

## 2024-05-30 DIAGNOSIS — I10 Essential (primary) hypertension: Secondary | ICD-10-CM | POA: Diagnosis not present

## 2024-05-30 DIAGNOSIS — R7303 Prediabetes: Secondary | ICD-10-CM | POA: Diagnosis not present

## 2024-05-30 NOTE — Progress Notes (Signed)
 Established Patient Office Visit  Subjective:  Patient ID: Allison Harvey, female    DOB: October 09, 1950  Age: 73 y.o. MRN: [id number]  Chief Complaint  Patient presents with   Follow-up    3 Months Follow Up NK Pt    Patient in office for 3 month follow up. Patient doing well overall, complains of hot flashes. Discussed options, prescription and OTC. Patient wants to try supplements, including magnesium. Patient fasting today, will get lab work. Will call with results.  States she received her flu vaccination yesterday. Continue same medications.     No other concerns at this time.   Past Medical History:  Diagnosis Date   Anxiety    Cataract    Depression    GERD (gastroesophageal reflux disease)    H/O seasonal allergies    Hyperlipidemia    Hypertension     Past Surgical History:  Procedure Laterality Date   BREAST BIOPSY Right 07/13/2015   benign   COLONOSCOPY WITH PROPOFOL  N/A 09/20/2015   Procedure: COLONOSCOPY WITH PROPOFOL ;  Surgeon: Gladis RAYMOND Mariner, MD;  Location: [facility] ENDOSCOPY;  Service: Endoscopy;  Laterality: N/A;   LIPOMA EXCISION Right 03/29/2018   Procedure: EXCISION LIPOMA- RIGHT FOREARM;  Surgeon: Curvin Deward MOULD, MD;  Location: [facility] ORS;  Service: General;  Laterality: Right;   TUBAL LIGATION      Social History   Socioeconomic History   Marital status: Married    Spouse name: Not on file   Number of children: Not on file   Years of education: Not on file   Highest education level: Not on file  Occupational History   Not on file  Tobacco Use   Smoking status: Former    Current packs/day: 0.00    Types: Cigarettes    Quit date: 09/19/1994    Years since quitting: 29.7   Smokeless tobacco: Never  Vaping Use   Vaping status: Never Used  Substance and Sexual Activity   Alcohol use: No   Drug use: No   Sexual activity: Not Currently    Birth control/protection: Post-menopausal  Other Topics Concern   Not on file  Social History Narrative    Not on file   Social Drivers of Health   Financial Resource Strain: Low Risk  (08/09/2023)   Overall Financial Resource Strain (CARDIA)    Difficulty of Paying Living Expenses: Not hard at all  Food Insecurity: No Food Insecurity (08/09/2023)   Hunger Vital Sign    Worried About Running Out of Food in the Last Year: Never true    Ran Out of Food in the Last Year: Never true  Transportation Needs: No Transportation Needs (08/09/2023)   PRAPARE - Administrator, Civil Service (Medical): No    Lack of Transportation (Non-Medical): No  Physical Activity: Not on file  Stress: No Stress Concern Present (08/09/2023)   [employer] of [employer]ational [employer] - Occupational Stress Questionnaire    Feeling of Stress : Only a little  Social Connections: Not on file  Intimate Partner Violence: Not At Risk (08/09/2023)   Humiliation, Afraid, Rape, and Kick questionnaire    Fear of Current or Ex-Partner: No    Emotionally Abused: No    Physically Abused: No    Sexually Abused: No    Family History  Problem Relation Age of Onset   Stroke Mother        or MI   Heart disease Mother    Alcohol abuse Father  Alcohol abuse Sister    Hypertension Son    Breast cancer Neg Hx     No Known Allergies  Outpatient Medications Prior to Visit  Medication Sig   acetaminophen  (TYLENOL ) 500 MG tablet Take 1,000 mg by mouth daily as needed for moderate pain or headache.   alendronate  (FOSAMAX ) 70 MG tablet Take 1 tablet (70 mg total) by mouth once a week.   atorvastatin  (LIPITOR) 20 MG tablet Take 1 tablet (20 mg total) by mouth daily.   busPIRone  (BUSPAR ) 15 MG tablet Take 1 tablet by mouth twice daily   Cholecalciferol (VITAMIN D-1000 MAX ST) 25 MCG (1000 UT) tablet Take 1,000 Units by mouth daily.   Cholecalciferol (VITAMIN D3) 50 MCG (2000 UT) CAPS Take 1 capsule by mouth daily.   DENTA 5000 PLUS 1.1 % CREA dental cream Place 1 Application onto teeth at bedtime.   FLUoxetine  (PROZAC )  20 MG capsule Take 1 capsule by mouth once daily   fluticasone  (FLONASE ) 50 MCG/ACT nasal spray Place 1 spray into both nostrils daily as needed for allergies or rhinitis.   lisinopril -hydrochlorothiazide  (ZESTORETIC ) 20-12.5 MG tablet Take 1 tablet by mouth daily.   meloxicam  (MOBIC ) 7.5 MG tablet Take 1 tablet by mouth twice daily   Omega-3 Fatty Acids (FISH OIL) 1000 MG CAPS Take 1 capsule by mouth once.   polyethylene glycol (MIRALAX ) 17 g packet Take 17 g by mouth daily.   vitamin B-12 (CYANOCOBALAMIN) 500 MCG tablet Take 500 mcg by mouth daily.   [DISCONTINUED] Calcium  Carb-Cholecalciferol 600-400 MG-UNIT CAPS Take by mouth. (Patient not taking: Reported on 02/28/2024)   No facility-administered medications prior to visit.    Review of Systems  Constitutional: Negative.   HENT: Negative.    Eyes: Negative.   Respiratory: Negative.  Negative for shortness of breath.   Cardiovascular: Negative.  Negative for chest pain.  Gastrointestinal: Negative.  Negative for abdominal pain, constipation and diarrhea.  Genitourinary: Negative.   Musculoskeletal:  Negative for joint pain and myalgias.  Skin: Negative.   Neurological: Negative.  Negative for dizziness and headaches.  Endo/Heme/Allergies: Negative.   All other systems reviewed and are negative.      Objective:   BP 124/78   Pulse 70   Ht 4' 11 (1.499 m)   Wt 123 lb (55.8 kg)   SpO2 99%   BMI 24.84 kg/m   Vitals:   05/30/24 0926  BP: 124/78  Pulse: 70  Height: 4' 11 (1.499 m)  Weight: 123 lb (55.8 kg)  SpO2: 99%  BMI (Calculated): 24.83    Physical Exam Vitals and nursing note reviewed.  Constitutional:      Appearance: Normal appearance. She is normal weight.  HENT:     Head: Normocephalic and atraumatic.     Nose: Nose normal.     Mouth/Throat:     Mouth: Mucous membranes are moist.  Eyes:     Extraocular Movements: Extraocular movements intact.     Conjunctiva/sclera: Conjunctivae normal.      Pupils: Pupils are equal, round, and reactive to light.  Cardiovascular:     Rate and Rhythm: Normal rate and regular rhythm.     Pulses: Normal pulses.     Heart sounds: Normal heart sounds.  Pulmonary:     Effort: Pulmonary effort is normal.     Breath sounds: Normal breath sounds.  Abdominal:     General: Abdomen is flat. Bowel sounds are normal.     Palpations: Abdomen is soft.  Musculoskeletal:  General: Normal range of motion.     Cervical back: Normal range of motion.  Skin:    General: Skin is warm and dry.  Neurological:     General: No focal deficit present.     Mental Status: She is alert and oriented to person, place, and time.  Psychiatric:        Mood and Affect: Mood normal.        Behavior: Behavior normal.        Thought Content: Thought content normal.        Judgment: Judgment normal.      No results found for any visits on 05/30/24.  No results found for this or any previous visit (from the past 2160 hours).    Assessment & Plan:  Magnesium for hot flashes Continue same medications  Problem List Items Addressed This Visit       Cardiovascular and Mediastinum   Essential hypertension, benign - Primary   Relevant Orders   CMP14+EGFR     Other   Hyperlipidemia   Relevant Orders   Lipid Profile   Prediabetes   Relevant Orders   Hemoglobin A1c    Return in about 3 months (around 08/29/2024) for with NK, also needs medicare wellness exam.   Total time spent: 25 minutes  Lonetta Blassingame, NP  05/30/2024   This document may have been prepared by Dragon Voice Recognition software and as such may include unintentional dictation errors.

## 2024-05-31 LAB — CMP14+EGFR
ALT: 15 IU/L (ref 0–32)
AST: 19 IU/L (ref 0–40)
Albumin: 4.3 g/dL (ref 3.8–4.8)
Alkaline Phosphatase: 94 IU/L (ref 49–135)
BUN/Creatinine Ratio: 24 (ref 12–28)
BUN: 21 mg/dL (ref 8–27)
Bilirubin Total: 0.3 mg/dL (ref 0.0–1.2)
CO2: 24 mmol/L (ref 20–29)
Calcium: 9.9 mg/dL (ref 8.7–10.3)
Chloride: 103 mmol/L (ref 96–106)
Creatinine, Ser: 0.86 mg/dL (ref 0.57–1.00)
Globulin, Total: 2 g/dL (ref 1.5–4.5)
Glucose: 84 mg/dL (ref 70–99)
Potassium: 4.2 mmol/L (ref 3.5–5.2)
Sodium: 142 mmol/L (ref 134–144)
Total Protein: 6.3 g/dL (ref 6.0–8.5)
eGFR: 71 mL/min/1.73 (ref >59)

## 2024-05-31 LAB — LIPID PANEL
Chol/HDL Ratio: 2.6 ratio (ref 0.0–4.4)
Cholesterol, Total: 155 mg/dL (ref 100–199)
HDL: 59 mg/dL (ref >39)
LDL Chol Calc (NIH): 82 mg/dL (ref 0–99)
Triglycerides: 75 mg/dL (ref 0–149)
VLDL Cholesterol Cal: 14 mg/dL (ref 5–40)

## 2024-05-31 LAB — HEMOGLOBIN A1C
Est. average glucose Bld gHb Est-mCnc: 126 mg/dL
Hgb A1c MFr Bld: 6 % — ABNORMAL HIGH (ref 4.8–5.6)

## 2024-06-02 ENCOUNTER — Ambulatory Visit: Payer: Self-pay | Admitting: Cardiology

## 2024-06-23 ENCOUNTER — Other Ambulatory Visit: Payer: Self-pay | Admitting: Internal Medicine

## 2024-06-23 DIAGNOSIS — F411 Generalized anxiety disorder: Secondary | ICD-10-CM

## 2024-08-06 ENCOUNTER — Other Ambulatory Visit: Payer: Self-pay | Admitting: Internal Medicine

## 2024-08-06 DIAGNOSIS — M25511 Pain in right shoulder: Secondary | ICD-10-CM

## 2024-08-08 ENCOUNTER — Other Ambulatory Visit: Payer: Self-pay | Admitting: Internal Medicine

## 2024-08-08 DIAGNOSIS — Z1231 Encounter for screening mammogram for malignant neoplasm of breast: Secondary | ICD-10-CM

## 2024-08-08 DIAGNOSIS — I10 Essential (primary) hypertension: Secondary | ICD-10-CM

## 2024-08-15 ENCOUNTER — Ambulatory Visit (INDEPENDENT_AMBULATORY_CARE_PROVIDER_SITE_OTHER): Admitting: Internal Medicine

## 2024-08-15 ENCOUNTER — Encounter: Payer: Self-pay | Admitting: Internal Medicine

## 2024-08-15 VITALS — BP 134/86 | HR 75 | Ht 59.0 in | Wt 126.0 lb

## 2024-08-15 DIAGNOSIS — F411 Generalized anxiety disorder: Secondary | ICD-10-CM

## 2024-08-15 DIAGNOSIS — I10 Essential (primary) hypertension: Secondary | ICD-10-CM

## 2024-08-15 DIAGNOSIS — Z6825 Body mass index (BMI) 25.0-25.9, adult: Secondary | ICD-10-CM | POA: Insufficient documentation

## 2024-08-15 DIAGNOSIS — Z1211 Encounter for screening for malignant neoplasm of colon: Secondary | ICD-10-CM | POA: Diagnosis not present

## 2024-08-15 DIAGNOSIS — Z0000 Encounter for general adult medical examination without abnormal findings: Secondary | ICD-10-CM | POA: Insufficient documentation

## 2024-08-15 DIAGNOSIS — Z0001 Encounter for general adult medical examination with abnormal findings: Secondary | ICD-10-CM

## 2024-08-15 DIAGNOSIS — E782 Mixed hyperlipidemia: Secondary | ICD-10-CM | POA: Diagnosis not present

## 2024-08-15 DIAGNOSIS — E663 Overweight: Secondary | ICD-10-CM | POA: Diagnosis not present

## 2024-08-15 DIAGNOSIS — R7303 Prediabetes: Secondary | ICD-10-CM | POA: Diagnosis not present

## 2024-08-15 DIAGNOSIS — E538 Deficiency of other specified B group vitamins: Secondary | ICD-10-CM | POA: Diagnosis not present

## 2024-08-15 DIAGNOSIS — E559 Vitamin D deficiency, unspecified: Secondary | ICD-10-CM | POA: Insufficient documentation

## 2024-08-15 DIAGNOSIS — Z13820 Encounter for screening for osteoporosis: Secondary | ICD-10-CM | POA: Insufficient documentation

## 2024-08-15 MED ORDER — FLUOXETINE HCL 10 MG PO CAPS: 10.0000 mg | ORAL_CAPSULE | Freq: Every day | ORAL | 3 refills | Status: AC

## 2024-08-15 NOTE — Progress Notes (Signed)
 Established Patient Office Visit  Subjective:  Patient ID: Allison Harvey, female    DOB: [birth year]  Age: 73 y.o. MRN: [id number]  Chief Complaint  Patient presents with   Annual Exam    AWV    Patient is here today for Medicare AWV.  She reports having increased anxiety recently. She would like to increase Prozac  to 30 gm daily. Will add on 10 mg Prozac  to her current 20 mg capsule daily. She should continue Buspar  twice daily.  She is still having hot flashes. She takes magnesium and it hasn't been helping with hot flashes. She does report it has been helping with her muscle cramps so she would like to continue taking it. Recommend patient could try wild yam cream before bed to help with hot flashes during the night.  Patient endorses occasional indigestion and difficulty swallowing but it does not happen often. She is taking Fosamax  weekly for osteoporosis. Discussed switching her medication to Prolia injection. Patient would like to wait until her upcoming DEXA scan is completed before changing her medication. Reinforced taking fosamax  as prescribed and avoiding dietary triggers that can make heart burn worse.  She reports doing well and has no additional complaints at this time. Mammogram is scheduled 09/02/24. DEXA scan 09/2022 will order to be completed 09/2024. Colonoscopy was completed 2017 and she had multiple tubular adenomas. Will order cologuard to be completed. 6CIT score 2; PHQ-9 score ; GAD-7 score 7 today. Patient is fasting today and due for routine blood work.    No other concerns at this time.   Past Medical History:  Diagnosis Date   Anxiety    Cataract    Depression    GERD (gastroesophageal reflux disease)    H/O seasonal allergies    Hyperlipidemia    Hypertension     Past Surgical History:  Procedure Laterality Date   BREAST BIOPSY Right 07/13/2015   benign   COLONOSCOPY WITH PROPOFOL  N/A 09/20/2015   Procedure: COLONOSCOPY WITH PROPOFOL ;   Surgeon: Gladis RAYMOND Mariner, MD;  Location: [facility] ENDOSCOPY;  Service: Endoscopy;  Laterality: N/A;   LIPOMA EXCISION Right 03/29/2018   Procedure: EXCISION LIPOMA- RIGHT FOREARM;  Surgeon: Curvin Deward MOULD, MD;  Location: [facility] ORS;  Service: General;  Laterality: Right;   TUBAL LIGATION      Social History   Socioeconomic History   Marital status: Married    Spouse name: Not on file   Number of children: Not on file   Years of education: Not on file   Highest education level: Not on file  Occupational History   Not on file  Tobacco Use   Smoking status: Former    Current packs/day: 0.00    Types: Cigarettes    Quit date: 09/19/1994    Years since quitting: 29.9   Smokeless tobacco: Never  Vaping Use   Vaping status: Never Used  Substance and Sexual Activity   Alcohol use: No   Drug use: No   Sexual activity: Not Currently    Birth control/protection: Post-menopausal  Other Topics Concern   Not on file  Social History Narrative   Not on file   Social Drivers of Health   Tobacco Use: Medium Risk (08/15/2024)   Patient History    Smoking Tobacco Use: Former    Smokeless Tobacco Use: Never    Passive Exposure: Not on file  Financial Resource Strain: Low Risk (08/09/2023)   Overall Financial Resource Strain (CARDIA)    Difficulty of Paying Living  Expenses: Not hard at all  Food Insecurity: No Food Insecurity (08/09/2023)   Hunger Vital Sign    Worried About Running Out of Food in the Last Year: Never true    Ran Out of Food in the Last Year: Never true  Transportation Needs: No Transportation Needs (08/09/2023)   PRAPARE - Administrator, Civil Service (Medical): No    Lack of Transportation (Non-Medical): No  Physical Activity: Not on file  Stress: No Stress Concern Present (08/09/2023)   [employer] of Occupational Health - Occupational Stress Questionnaire    Feeling of Stress : Only a little  Social Connections: Not on file  Intimate Partner Violence:  Not At Risk (08/09/2023)   Humiliation, Afraid, Rape, and Kick questionnaire    Fear of Current or Ex-Partner: No    Emotionally Abused: No    Physically Abused: No    Sexually Abused: No  Depression (PHQ2-9): Medium Risk (08/09/2023)   Depression (PHQ2-9)    PHQ-2 Score: 5  Alcohol Screen: Low Risk (08/09/2023)   Alcohol Screen    Last Alcohol Screening Score (AUDIT): 0  Housing: Low Risk (08/09/2023)   Housing    Last Housing Risk Score: 0  Utilities: Not At Risk (08/09/2023)   AHC Utilities    Threatened with loss of utilities: No  Health Literacy: Adequate Health Literacy (08/09/2023)   B1300 Health Literacy    Frequency of need for help with medical instructions: Never    Family History  Problem Relation Age of Onset   Stroke Mother        or MI   Heart disease Mother    Alcohol abuse Father    Alcohol abuse Sister    Hypertension Son    Breast cancer Neg Hx     Allergies[1]  Show/hide medication list[2]  Review of Systems  Constitutional:  Positive for diaphoresis (at bedtime). Negative for chills, fever and malaise/fatigue.  HENT: Negative.  Negative for congestion and sore throat.   Eyes: Negative.  Negative for blurred vision and pain.  Respiratory: Negative.  Negative for cough and shortness of breath.   Cardiovascular: Negative.  Negative for chest pain, palpitations and leg swelling.  Gastrointestinal:  Positive for heartburn (on occasion). Negative for abdominal pain, blood in stool, constipation, diarrhea, melena, nausea and vomiting.  Genitourinary: Negative.  Negative for dysuria, flank pain, frequency and urgency.  Musculoskeletal: Negative.  Negative for joint pain and myalgias.  Skin: Negative.   Neurological: Negative.  Negative for dizziness, tingling, sensory change, weakness and headaches.  Endo/Heme/Allergies: Negative.   Psychiatric/Behavioral:  Negative for depression and suicidal ideas. The patient is nervous/anxious.        Objective:    BP 134/86   Pulse 75   Ht 4' 11 (1.499 m)   Wt 126 lb (57.2 kg)   SpO2 97%   BMI 25.45 kg/m   Vitals:   08/15/24 0940  BP: 134/86  Pulse: 75  Height: 4' 11 (1.499 m)  Weight: 126 lb (57.2 kg)  SpO2: 97%  BMI (Calculated): 25.44    Physical Exam Vitals and nursing note reviewed.  Constitutional:      Appearance: Normal appearance.  HENT:     Head: Normocephalic and atraumatic.     Nose: Nose normal.     Mouth/Throat:     Mouth: Mucous membranes are moist.     Pharynx: Oropharynx is clear.  Eyes:     Conjunctiva/sclera: Conjunctivae normal.     Pupils: Pupils are  equal, round, and reactive to light.  Cardiovascular:     Rate and Rhythm: Normal rate and regular rhythm.     Pulses: Normal pulses.     Heart sounds: Normal heart sounds. No murmur heard. Pulmonary:     Effort: Pulmonary effort is normal.     Breath sounds: Normal breath sounds. No wheezing.  Abdominal:     General: Bowel sounds are normal.     Palpations: Abdomen is soft.     Tenderness: There is no abdominal tenderness. There is no right CVA tenderness or left CVA tenderness.  Musculoskeletal:        General: Normal range of motion.     Cervical back: Normal range of motion.     Right lower leg: No edema.     Left lower leg: No edema.  Skin:    General: Skin is warm and dry.  Neurological:     General: No focal deficit present.     Mental Status: She is alert and oriented to person, place, and time.  Psychiatric:        Mood and Affect: Mood normal.        Behavior: Behavior normal.      No results found for any visits on 08/15/24.  Recent Results (from the past 2160 hours)  CMP14+EGFR     Status: None   Collection Time: 05/30/24  9:48 AM  Result Value Ref Range   Glucose 84 70 - 99 mg/dL   BUN 21 8 - 27 mg/dL   Creatinine, Ser 9.13 0.57 - 1.00 mg/dL   eGFR 71 >40 fO/fpw/8.26   BUN/Creatinine Ratio 24 12 - 28   Sodium 142 134 - 144 mmol/L   Potassium 4.2 3.5 - 5.2 mmol/L    Chloride 103 96 - 106 mmol/L   CO2 24 20 - 29 mmol/L   Calcium  9.9 8.7 - 10.3 mg/dL   Total Protein 6.3 6.0 - 8.5 g/dL   Albumin 4.3 3.8 - 4.8 g/dL   Globulin, Total 2.0 1.5 - 4.5 g/dL   Bilirubin Total 0.3 0.0 - 1.2 mg/dL   Alkaline Phosphatase 94 49 - 135 IU/L    Comment:               **Please note reference interval change**   AST 19 0 - 40 IU/L   ALT 15 0 - 32 IU/L  Lipid Profile     Status: None   Collection Time: 05/30/24  9:48 AM  Result Value Ref Range   Cholesterol, Total 155 100 - 199 mg/dL   Triglycerides 75 0 - 149 mg/dL   HDL 59 >60 mg/dL   VLDL Cholesterol Cal 14 5 - 40 mg/dL   LDL Chol Calc (NIH) 82 0 - 99 mg/dL   Chol/HDL Ratio 2.6 0.0 - 4.4 ratio    Comment:                                   T. Chol/HDL Ratio                                             Men  Women  1/2 Avg.Risk  3.4    3.3                                   Avg.Risk  5.0    4.4                                2X Avg.Risk  9.6    7.1                                3X Avg.Risk 23.4   11.0   Hemoglobin A1c     Status: Abnormal   Collection Time: 05/30/24  9:48 AM  Result Value Ref Range   Hgb A1c MFr Bld 6.0 (H) 4.8 - 5.6 %    Comment:          Prediabetes: 5.7 - 6.4          Diabetes: >6.4          Glycemic control for adults with diabetes: <7.0    Est. average glucose Bld gHb Est-mCnc 126 mg/dL      Assessment & Plan:  Increase prozac  to 30 mg daily. Continue other medications as prescribed. Reinforced healthy diet and exercise as tolerated. Routine blood work to be collected today and FU with patient on results. Cologuard and DEXA scan ordered. Keep upcoming mammogram appointment. Problem List Items Addressed This Visit       Cardiovascular and Mediastinum   Essential hypertension, benign   Relevant Orders   CMP14+EGFR     Other   Hyperlipidemia   GAD (generalized anxiety disorder)   Relevant Medications   FLUoxetine  (PROZAC ) 10 MG capsule   Prediabetes    Relevant Orders   Hemoglobin A1c   Medicare annual wellness visit, subsequent - Primary   Overweight with body mass index (BMI) of 25 to 25.9 in adult   Vitamin D deficiency   Relevant Orders   Vitamin D (25 hydroxy)   B12 deficiency   Relevant Orders   Vitamin B12   Screening for osteoporosis   Relevant Orders   DG Bone Density   Colon cancer screening    Return in about 6 weeks (around 09/26/2024).   Total time spent: 30 minutes. This time includes review of previous notes and results and patient face to face interaction during today's visit.    FERNAND FREDY RAMAN, MD  08/15/2024   This document may have been prepared by [facility] Voice Recognition software and as such may include unintentional dictation errors.     [1] No Known Allergies [2]  Outpatient Medications Prior to Visit  Medication Sig   acetaminophen  (TYLENOL ) 500 MG tablet Take 1,000 mg by mouth daily as needed for moderate pain or headache.   alendronate  (FOSAMAX ) 70 MG tablet Take 1 tablet (70 mg total) by mouth once a week.   atorvastatin  (LIPITOR) 20 MG tablet Take 1 tablet (20 mg total) by mouth daily.   busPIRone  (BUSPAR ) 15 MG tablet Take 1 tablet by mouth twice daily   Cholecalciferol (VITAMIN D-1000 MAX ST) 25 MCG (1000 UT) tablet Take 1,000 Units by mouth daily.   DENTA 5000 PLUS 1.1 % CREA dental cream Place 1 Application onto teeth at bedtime.   fluticasone  (FLONASE ) 50 MCG/ACT nasal spray Place 1 spray into both nostrils daily as needed for  allergies or rhinitis.   lisinopril -hydrochlorothiazide  (ZESTORETIC ) 20-12.5 MG tablet Take 1 tablet by mouth once daily   meloxicam  (MOBIC ) 7.5 MG tablet Take 1 tablet by mouth twice daily (Patient taking differently: Take 7.5 mg by mouth daily.)   Omega-3 Fatty Acids (FISH OIL) 1000 MG CAPS Take 1 capsule by mouth once.   vitamin B-12 (CYANOCOBALAMIN) 500 MCG tablet Take 500 mcg by mouth daily.   [DISCONTINUED] FLUoxetine  (PROZAC ) 20 MG capsule Take 1 capsule by  mouth once daily   [DISCONTINUED] Cholecalciferol (VITAMIN D3) 50 MCG (2000 UT) CAPS Take 1 capsule by mouth daily. (Patient not taking: Reported on 08/15/2024)   [DISCONTINUED] polyethylene glycol (MIRALAX ) 17 g packet Take 17 g by mouth daily. (Patient not taking: Reported on 08/15/2024)   No facility-administered medications prior to visit.

## 2024-08-16 LAB — CMP14+EGFR
ALT: 20 IU/L (ref 0–32)
AST: 19 IU/L (ref 0–40)
Albumin: 4.5 g/dL (ref 3.8–4.8)
Alkaline Phosphatase: 100 IU/L (ref 49–135)
BUN/Creatinine Ratio: 27 (ref 12–28)
BUN: 21 mg/dL (ref 8–27)
Bilirubin Total: 0.2 mg/dL (ref 0.0–1.2)
CO2: 26 mmol/L (ref 20–29)
Calcium: 9.7 mg/dL (ref 8.7–10.3)
Chloride: 100 mmol/L (ref 96–106)
Creatinine, Ser: 0.79 mg/dL (ref 0.57–1.00)
Globulin, Total: 2.1 g/dL (ref 1.5–4.5)
Glucose: 76 mg/dL (ref 70–99)
Potassium: 4.3 mmol/L (ref 3.5–5.2)
Sodium: 140 mmol/L (ref 134–144)
Total Protein: 6.6 g/dL (ref 6.0–8.5)
eGFR: 78 mL/min/1.73 (ref >59)

## 2024-08-16 LAB — VITAMIN B12: Vitamin B-12: 1214 pg/mL (ref 232–1245)

## 2024-08-16 LAB — HEMOGLOBIN A1C
Est. average glucose Bld gHb Est-mCnc: 128 mg/dL
Hgb A1c MFr Bld: 6.1 % — ABNORMAL HIGH (ref 4.8–5.6)

## 2024-08-16 LAB — VITAMIN D 25 HYDROXY (VIT D DEFICIENCY, FRACTURES): Vit D, 25-Hydroxy: 44 ng/mL (ref 30.0–100.0)

## 2024-08-18 ENCOUNTER — Ambulatory Visit: Payer: Self-pay | Admitting: Internal Medicine

## 2024-08-18 NOTE — Telephone Encounter (Signed)
 Pt informed about lab results. Pt is asking about a herbal topical she was told about at her last appointment for hot flashes and was requesting that be sent in.

## 2024-08-20 NOTE — Progress Notes (Signed)
 Patient notified

## 2024-09-02 ENCOUNTER — Ambulatory Visit
Admission: RE | Admit: 2024-09-02 | Discharge: 2024-09-02 | Disposition: A | Discharge status: Home / Self Care | Source: Ambulatory Visit | Attending: Internal Medicine | Admitting: Internal Medicine

## 2024-09-02 DIAGNOSIS — Z1231 Encounter for screening mammogram for malignant neoplasm of breast: Secondary | ICD-10-CM | POA: Insufficient documentation

## 2024-09-06 LAB — COLOGUARD: COLOGUARD: NEGATIVE

## 2024-09-17 ENCOUNTER — Other Ambulatory Visit: Payer: Self-pay | Admitting: Internal Medicine

## 2024-09-17 DIAGNOSIS — F411 Generalized anxiety disorder: Secondary | ICD-10-CM

## 2024-09-28 ENCOUNTER — Other Ambulatory Visit: Payer: Self-pay | Admitting: Internal Medicine

## 2024-09-28 DIAGNOSIS — E782 Mixed hyperlipidemia: Secondary | ICD-10-CM

## 2024-10-03 ENCOUNTER — Ambulatory Visit: Payer: Medicare (Managed Care) | Admitting: Internal Medicine

## 2024-10-03 ENCOUNTER — Encounter: Payer: Self-pay | Admitting: Internal Medicine

## 2024-10-03 VITALS — BP 130/84 | HR 83 | Ht 59.0 in | Wt 124.8 lb

## 2024-10-03 DIAGNOSIS — E782 Mixed hyperlipidemia: Secondary | ICD-10-CM | POA: Diagnosis not present

## 2024-10-03 DIAGNOSIS — R7303 Prediabetes: Secondary | ICD-10-CM

## 2024-10-03 DIAGNOSIS — F411 Generalized anxiety disorder: Secondary | ICD-10-CM | POA: Diagnosis not present

## 2024-10-03 DIAGNOSIS — I10 Essential (primary) hypertension: Secondary | ICD-10-CM

## 2024-10-03 DIAGNOSIS — Z13820 Encounter for screening for osteoporosis: Secondary | ICD-10-CM

## 2024-10-03 NOTE — Progress Notes (Signed)
 "  Established Patient Office Visit  Subjective:  Patient ID: Allison Harvey, female    DOB: 1950/10/26  Age: 74 y.o. MRN: [id number]  Chief Complaint  Patient presents with   Follow-up    6 week follow up    Patient comes in for her follow-up today accompanied by her daughter.  She is feeling well and has no new complaints.  She is tolerating her Prozac  30 mg and is definitely feeling better.  Sometimes she forgets to take her BuSpar  but she will now start taking it regularly.  Still needs to get her DEXA scan.  Her mammogram is up-to-date.  Will check her labs at next visit.    No other concerns at this time.   Past Medical History:  Diagnosis Date   Anxiety    Cataract    Depression    GERD (gastroesophageal reflux disease)    H/O seasonal allergies    Hyperlipidemia    Hypertension     Past Surgical History:  Procedure Laterality Date   BREAST BIOPSY Right 07/13/2015   benign   COLONOSCOPY WITH PROPOFOL  N/A 09/20/2015   Procedure: COLONOSCOPY WITH PROPOFOL ;  Surgeon: Gladis RAYMOND Mariner, MD;  Location: [facility] ENDOSCOPY;  Service: Endoscopy;  Laterality: N/A;   LIPOMA EXCISION Right 03/29/2018   Procedure: EXCISION LIPOMA- RIGHT FOREARM;  Surgeon: Curvin Deward MOULD, MD;  Location: [facility] ORS;  Service: General;  Laterality: Right;   TUBAL LIGATION      Social History   Socioeconomic History   Marital status: Married    Spouse name: Not on file   Number of children: Not on file   Years of education: Not on file   Highest education level: Not on file  Occupational History   Not on file  Tobacco Use   Smoking status: Former    Current packs/day: 0.00    Types: Cigarettes    Quit date: 09/19/1994    Years since quitting: 30.0   Smokeless tobacco: Never  Vaping Use   Vaping status: Never Used  Substance and Sexual Activity   Alcohol use: No   Drug use: No   Sexual activity: Not Currently    Birth control/protection: Post-menopausal  Other Topics Concern   Not on file   Social History Narrative   Not on file   Social Drivers of Health   Tobacco Use: Medium Risk (10/03/2024)   Patient History    Smoking Tobacco Use: Former    Smokeless Tobacco Use: Never    Passive Exposure: Not on Actuary Strain: Low Risk (08/09/2023)   Overall Financial Resource Strain (CARDIA)    Difficulty of Paying Living Expenses: Not hard at all  Food Insecurity: No Food Insecurity (08/09/2023)   Hunger Vital Sign    Worried About Running Out of Food in the Last Year: Never true    Ran Out of Food in the Last Year: Never true  Transportation Needs: No Transportation Needs (08/09/2023)   PRAPARE - Administrator, Civil Service (Medical): No    Lack of Transportation (Non-Medical): No  Physical Activity: Not on file  Stress: No Stress Concern Present (08/09/2023)   [employer] of Occupational Health - Occupational Stress Questionnaire    Feeling of Stress : Only a little  Social Connections: Not on file  Intimate Partner Violence: Not At Risk (08/09/2023)   Humiliation, Afraid, Rape, and Kick questionnaire    Fear of Current or Ex-Partner: No    Emotionally Abused: No  Physically Abused: No    Sexually Abused: No  Depression (PHQ2-9): Medium Risk (08/15/2024)   Depression (PHQ2-9)    PHQ-2 Score: 5  Alcohol Screen: Low Risk (08/09/2023)   Alcohol Screen    Last Alcohol Screening Score (AUDIT): 0  Housing: Low Risk (08/09/2023)   Housing    Last Housing Risk Score: 0  Utilities: Not At Risk (08/09/2023)   AHC Utilities    Threatened with loss of utilities: No  Health Literacy: Adequate Health Literacy (08/09/2023)   B1300 Health Literacy    Frequency of need for help with medical instructions: Never    Family History  Problem Relation Age of Onset   Stroke Mother        or MI   Heart disease Mother    Alcohol abuse Father    Alcohol abuse Sister    Hypertension Son    Breast cancer Neg Hx     Allergies[1]  Show/hide  medication list[2]  Review of Systems  Constitutional: Negative.  Negative for chills, fever and malaise/fatigue.  HENT: Negative.  Negative for congestion and sore throat.   Eyes: Negative.  Negative for blurred vision and pain.  Respiratory: Negative.  Negative for cough and shortness of breath.   Cardiovascular: Negative.  Negative for chest pain, palpitations and leg swelling.  Gastrointestinal: Negative.  Negative for abdominal pain, blood in stool, constipation, diarrhea, heartburn, melena, nausea and vomiting.  Genitourinary: Negative.  Negative for dysuria, flank pain, frequency and urgency.  Musculoskeletal: Negative.  Negative for joint pain and myalgias.  Skin: Negative.   Neurological: Negative.  Negative for dizziness, tingling, sensory change, weakness and headaches.  Endo/Heme/Allergies: Negative.   Psychiatric/Behavioral: Negative.  Negative for depression and suicidal ideas. The patient is not nervous/anxious.        Objective:   BP 130/84   Pulse 83   Ht 4' 11 (1.499 m)   Wt 124 lb 12.8 oz (56.6 kg)   SpO2 95%   BMI 25.21 kg/m   Vitals:   10/03/24 0939  BP: 130/84  Pulse: 83  Height: 4' 11 (1.499 m)  Weight: 124 lb 12.8 oz (56.6 kg)  SpO2: 95%  BMI (Calculated): 25.19    Physical Exam Vitals and nursing note reviewed.  Constitutional:      Appearance: Normal appearance.  HENT:     Head: Normocephalic and atraumatic.     Nose: Nose normal.     Mouth/Throat:     Mouth: Mucous membranes are moist.     Pharynx: Oropharynx is clear.  Eyes:     Conjunctiva/sclera: Conjunctivae normal.     Pupils: Pupils are equal, round, and reactive to light.  Cardiovascular:     Rate and Rhythm: Normal rate and regular rhythm.     Pulses: Normal pulses.     Heart sounds: Normal heart sounds. No murmur heard. Pulmonary:     Effort: Pulmonary effort is normal.     Breath sounds: Normal breath sounds. No wheezing.  Abdominal:     General: Bowel sounds are  normal.     Palpations: Abdomen is soft.     Tenderness: There is no abdominal tenderness. There is no right CVA tenderness or left CVA tenderness.  Musculoskeletal:        General: Normal range of motion.     Cervical back: Normal range of motion.     Right lower leg: No edema.     Left lower leg: No edema.  Skin:    General: Skin  is warm and dry.  Neurological:     General: No focal deficit present.     Mental Status: She is alert and oriented to person, place, and time.  Psychiatric:        Mood and Affect: Mood normal.        Behavior: Behavior normal.      No results found for any visits on 10/03/24.  Recent Results (from the past 2160 hours)  CMP14+EGFR     Status: None   Collection Time: 08/15/24 10:21 AM  Result Value Ref Range   Glucose 76 70 - 99 mg/dL   BUN 21 8 - 27 mg/dL   Creatinine, Ser 9.20 0.57 - 1.00 mg/dL   eGFR 78 >40 fO/fpw/8.26   BUN/Creatinine Ratio 27 12 - 28   Sodium 140 134 - 144 mmol/L   Potassium 4.3 3.5 - 5.2 mmol/L   Chloride 100 96 - 106 mmol/L   CO2 26 20 - 29 mmol/L   Calcium  9.7 8.7 - 10.3 mg/dL   Total Protein 6.6 6.0 - 8.5 g/dL   Albumin 4.5 3.8 - 4.8 g/dL   Globulin, Total 2.1 1.5 - 4.5 g/dL   Bilirubin Total 0.2 0.0 - 1.2 mg/dL   Alkaline Phosphatase 100 49 - 135 IU/L   AST 19 0 - 40 IU/L   ALT 20 0 - 32 IU/L  Hemoglobin A1c     Status: Abnormal   Collection Time: 08/15/24 10:21 AM  Result Value Ref Range   Hgb A1c MFr Bld 6.1 (H) 4.8 - 5.6 %    Comment:          Prediabetes: 5.7 - 6.4          Diabetes: >6.4          Glycemic control for adults with diabetes: <7.0    Est. average glucose Bld gHb Est-mCnc 128 mg/dL  Vitamin D  (25 hydroxy)     Status: None   Collection Time: 08/15/24 10:21 AM  Result Value Ref Range   Vit D, 25-Hydroxy 44.0 30.0 - 100.0 ng/mL    Comment: Vitamin D  deficiency has been defined by the Institute of Medicine and an Endocrine Society practice guideline as a level of serum 25-OH vitamin D  less  than 20 ng/mL (1,2). The Endocrine Society went on to further define vitamin D  insufficiency as a level between 21 and 29 ng/mL (2). 1. IOM (Institute of Medicine). 2010. Dietary reference    intakes for calcium  and D. [place]: The    [employer]. 2. Holick MF, Binkley Kalifornsky, Bischoff-Ferrari HA, et al.    Evaluation, treatment, and prevention of vitamin D     deficiency: an Endocrine Society clinical practice    guideline. JCEM. 2011 Jul; 96(7):1911-30.   Vitamin B12     Status: None   Collection Time: 08/15/24 10:21 AM  Result Value Ref Range   Vitamin B-12 1,214 232 - 1,245 pg/mL  Cologuard     Status: None   Collection Time: 09/01/24  6:28 AM  Result Value Ref Range   COLOGUARD Negative Negative    Comment: The Cologuard (TM) test was performed on this specimen.  NEGATIVE TEST RESULT. A negative Cologuard result indicates a low likelihood that a colorectal cancer (CRC) or advanced adenoma (adenomatous polyps with more advanced pre-malignant features) is present. The chance that a person with a negative Cologuard test has a colorectal cancer is less than 1 in 1500 (negative predictive value >99.9%) or has an advanced adenoma  is less than 5.3% (negative predictive value 94.7%). These data are based on a prospective cross-sectional study of 10,000 individuals at average risk for colorectal cancer who were screened with both Cologuard and colonoscopy. (Imperiale T. et al, N Engl J Med 2014;370(14):1286-1297) The normal value (reference range) for this assay is negative.  COLOGUARD RE-SCREENING RECOMMENDATION: Periodic colorectal cancer screening is an important part of preventive healthcare for asymptomatic individuals at average risk for colorectal cancer. Following a negative Cologuard  result, the American Cancer Society and U.S. Multi-Society Task Force screening guidelines recommend a Cologuard re-screening interval of 3 years.  References: American Cancer Society  Guideline for Colorectal Cancer Screening: https://www.cancer.org/cancer/colon-rectal-cancer/detection-diagnosis-staging/acs-recommendations.html.; Rex DK, Boland CR, Dominitz JK, Colorectal Cancer Screening: Recommendations for Physicians and Patients from the U.S. Multi-Society Task Force on Colorectal Cancer Screening , Am J Gastroenterology 2017; 112:1016-1030.  TEST DESCRIPTION: Composite algorithmic analysis of stool DNA-biomarkers with hemoglobin immunoassay.   Quantitative values of individual biomarkers are not reportable and are not associated with individual biomarker result reference ranges. Cologuard is intended for colorectal cancer screening of adults of either sex, 45 years or older, who are at average-risk for colorectal cancer (CRC). Cologuard has been approved for use by the U.S.  FDA. The performance of Cologuard was established in a cross sectional study of average-risk adults aged 65-84. Cologuard performance in patients ages 40 to 62 years was estimated by sub-group analysis of near-age groups. Colonoscopies performed for a positive result may find as the most clinically significant lesion: colorectal cancer [4.0%], advanced adenoma (including sessile serrated polyps greater than or equal to 1cm diameter) [20%] or non- advanced adenoma [31%]; or no colorectal neoplasia [45%]. These estimates are derived from a prospective cross-sectional screening study of 10,000 individuals at average risk for colorectal cancer who were screened with both Cologuard and colonoscopy. (Imperiale T. et al, LOISE [place] J Med 2014;370(14):1286-1297.) Cologuard may produce a false negative or false positive result (no colorectal cancer or precancerous polyp present at colonoscopy follow up). A negative Cologuard test result does not guarantee the absence of CRC or advanced adenoma  (pre-cancer). The current Cologuard screening interval is every 3 years. Science Writer and [place]. Therapist, [employer]).  Cologuard performance data in a 10,000 patient pivotal study using colonoscopy as the reference method can be accessed at the following location: www.exactlabs.com/results. Additional description of the Cologuard test process, warnings and precautions can be found at www.cologuard.com.       Assessment & Plan:  Continue current medications.  Schedule DEXA scan. Problem List Items Addressed This Visit       Cardiovascular and Mediastinum   Essential hypertension, benign - Primary     Other   Hyperlipidemia   GAD (generalized anxiety disorder)   Prediabetes   Screening for osteoporosis   Relevant Orders   DG Bone Density    Return in about 3 months (around 01/01/2025).   Total time spent: 30 minutes. This time includes review of previous notes and results and patient face to face interaction during today's visit.    FERNAND FREDY RAMAN, MD  10/03/2024   This document may have been prepared by [facility] Voice Recognition software and as such may include unintentional dictation errors.     [1] No Known Allergies [2]  Outpatient Medications Prior to Visit  Medication Sig   acetaminophen  (TYLENOL ) 500 MG tablet Take 1,000 mg by mouth daily as needed for moderate pain or headache.   alendronate  (FOSAMAX ) 70 MG tablet Take 1 tablet (70 mg total)  by mouth once a week.   atorvastatin  (LIPITOR) 20 MG tablet Take 1 tablet by mouth once daily   busPIRone  (BUSPAR ) 15 MG tablet Take 1 tablet by mouth twice daily   Cholecalciferol (VITAMIN D -1000 MAX ST) 25 MCG (1000 UT) tablet Take 1,000 Units by mouth daily.   DENTA 5000 PLUS 1.1 % CREA dental cream Place 1 Application onto teeth at bedtime.   FLUoxetine  (PROZAC ) 10 MG capsule Take 1 capsule (10 mg total) by mouth daily.   fluticasone  (FLONASE ) 50 MCG/ACT nasal spray Place 1 spray into both nostrils daily as needed for allergies or rhinitis.   lisinopril -hydrochlorothiazide  (ZESTORETIC ) 20-12.5 MG tablet Take 1 tablet by mouth once daily    meloxicam  (MOBIC ) 7.5 MG tablet Take 1 tablet by mouth twice daily (Patient taking differently: Take 7.5 mg by mouth daily.)   Omega-3 Fatty Acids (FISH OIL) 1000 MG CAPS Take 1 capsule by mouth once.   vitamin B-12 (CYANOCOBALAMIN) 500 MCG tablet Take 500 mcg by mouth daily.   No facility-administered medications prior to visit.   "

## 2024-10-22 ENCOUNTER — Other Ambulatory Visit: Payer: Medicare (Managed Care)

## 2024-12-30 ENCOUNTER — Ambulatory Visit: Payer: Medicare (Managed Care) | Admitting: Internal Medicine
